# Patient Record
Sex: Female | Born: 1965 | Race: Black or African American | Hispanic: No | Marital: Married | Smoking: Never smoker
Health system: Southern US, Community
[De-identification: ages and names within clinical notes are randomized; demographics above are authoritative.]

## PROBLEM LIST (undated history)

## (undated) DIAGNOSIS — I10 Essential (primary) hypertension: Secondary | ICD-10-CM

## (undated) DIAGNOSIS — D649 Anemia, unspecified: Secondary | ICD-10-CM

## (undated) DIAGNOSIS — E079 Disorder of thyroid, unspecified: Secondary | ICD-10-CM

## (undated) HISTORY — DX: Anemia, unspecified: D64.9

## (undated) HISTORY — DX: Disorder of thyroid, unspecified: E07.9

## (undated) HISTORY — DX: Essential (primary) hypertension: I10

---

## 2004-09-14 DIAGNOSIS — D509 Iron deficiency anemia, unspecified: Secondary | ICD-10-CM | POA: Insufficient documentation

## 2004-12-04 ENCOUNTER — Ambulatory Visit: Payer: Self-pay | Admitting: Family Medicine

## 2005-05-20 ENCOUNTER — Emergency Department: Payer: Self-pay | Admitting: Emergency Medicine

## 2005-12-30 ENCOUNTER — Ambulatory Visit: Payer: Self-pay | Admitting: Family Medicine

## 2007-01-06 ENCOUNTER — Ambulatory Visit: Payer: Self-pay | Admitting: Family Medicine

## 2007-02-14 ENCOUNTER — Emergency Department: Payer: Self-pay | Admitting: Unknown Physician Specialty

## 2007-04-18 ENCOUNTER — Ambulatory Visit: Payer: Self-pay | Admitting: Family Medicine

## 2007-12-25 ENCOUNTER — Emergency Department: Payer: Self-pay | Admitting: Emergency Medicine

## 2008-01-05 ENCOUNTER — Ambulatory Visit: Payer: Self-pay

## 2008-01-10 ENCOUNTER — Ambulatory Visit: Payer: Self-pay | Admitting: Family Medicine

## 2008-02-03 ENCOUNTER — Encounter: Payer: Self-pay | Admitting: Orthopaedic Surgery

## 2008-02-13 ENCOUNTER — Encounter: Payer: Self-pay | Admitting: Orthopaedic Surgery

## 2008-08-19 ENCOUNTER — Emergency Department: Payer: Self-pay | Admitting: Emergency Medicine

## 2008-08-20 ENCOUNTER — Ambulatory Visit: Payer: Self-pay | Admitting: Unknown Physician Specialty

## 2008-10-16 ENCOUNTER — Encounter: Payer: Self-pay | Admitting: General Practice

## 2008-11-12 ENCOUNTER — Encounter: Payer: Self-pay | Admitting: General Practice

## 2008-12-13 ENCOUNTER — Encounter: Payer: Self-pay | Admitting: General Practice

## 2009-01-15 ENCOUNTER — Ambulatory Visit: Payer: Self-pay | Admitting: Family Medicine

## 2009-04-04 IMAGING — CR LEFT WRIST - 2 VIEW
1 series · 2 of 2 positions shown · non-contrast
Comparison: none

REASON FOR EXAM: pain      rm 2
COMMENTS:

PROCEDURE:     DXR - DXR WRIST LEFT AP AND LATERAL  - August 19, 2008  [DATE]
RESULT:     An impacted distal comminuted posteriorly angulated and
displaced distal radius fracture is appreciated. A displaced ulnar styloid
fracture is also appreciated.

[Series 1: view not recorded · 0.17mm/px · 2 of 2 slices shown]
[im 1/2]
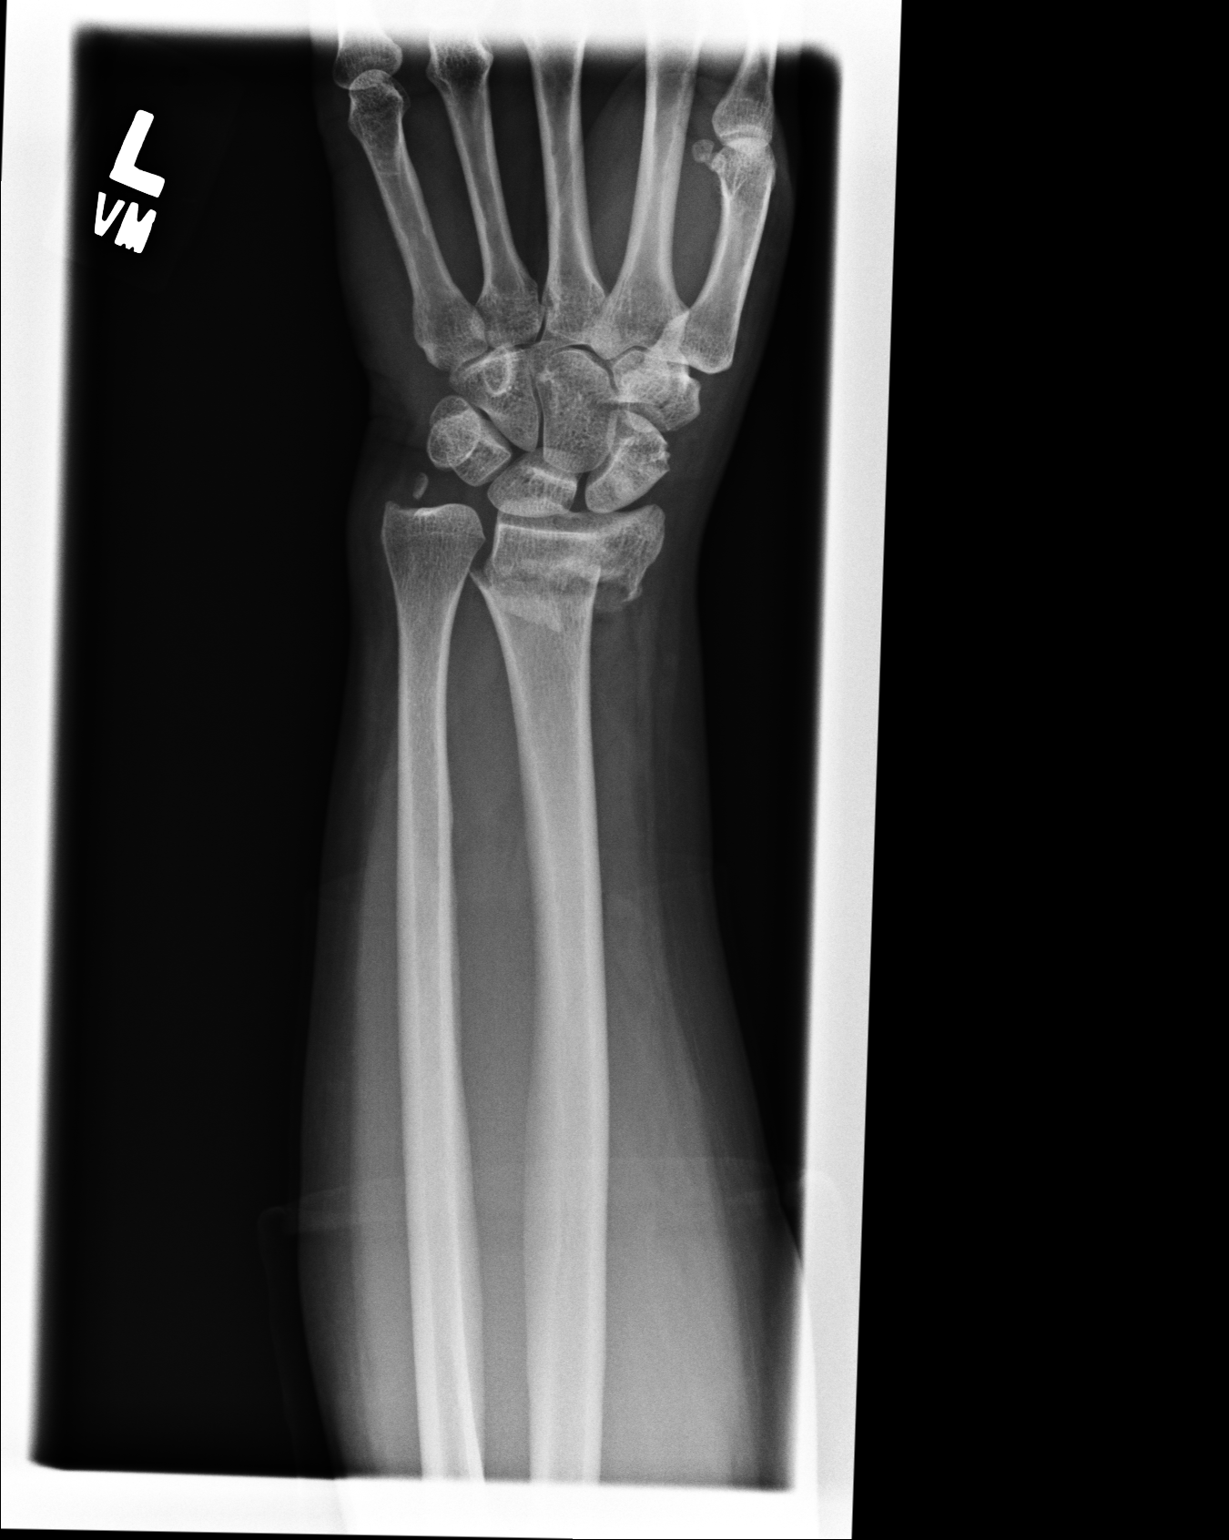
[im 2/2]
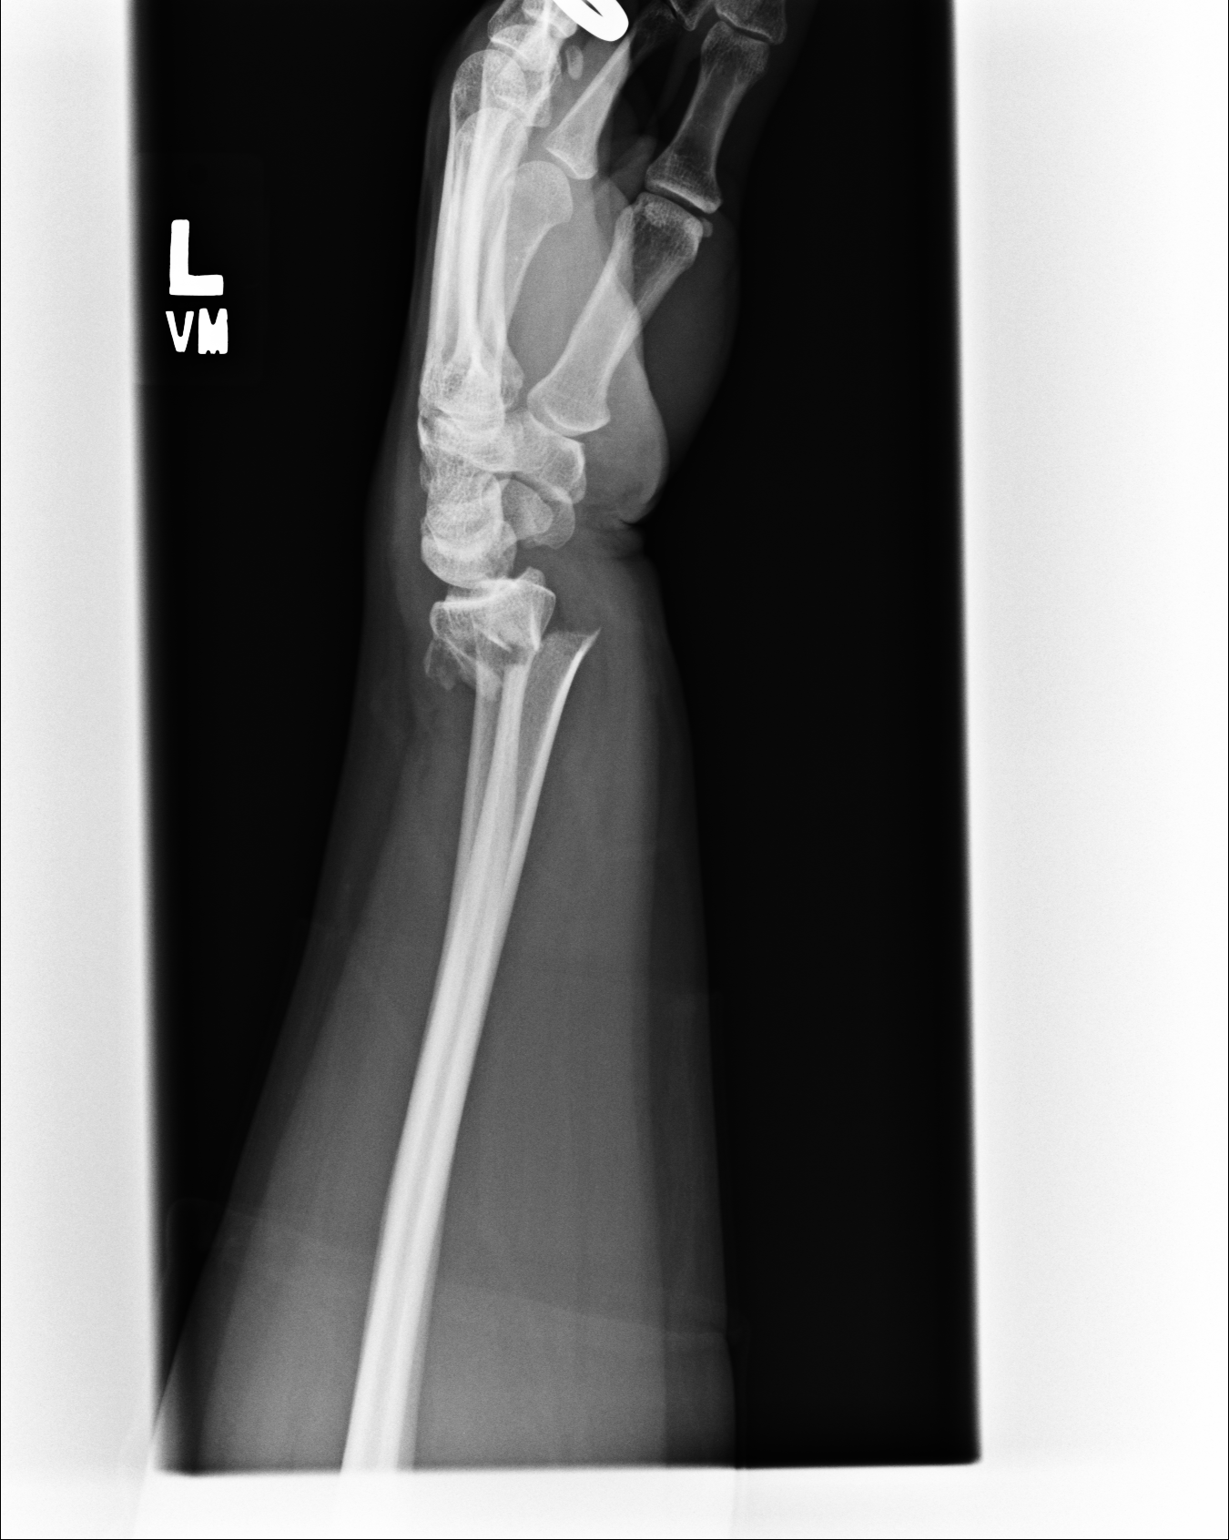

[2 of 2 positions shown; findings below may reference images not displayed]

IMPRESSION: Distal radial and ulna fracture as described above.

## 2010-01-16 ENCOUNTER — Ambulatory Visit: Payer: Self-pay | Admitting: Family Medicine

## 2010-01-24 ENCOUNTER — Ambulatory Visit: Payer: Self-pay | Admitting: Family Medicine

## 2011-01-20 ENCOUNTER — Ambulatory Visit: Payer: Self-pay | Admitting: Family Medicine

## 2011-01-20 LAB — HM MAMMOGRAPHY

## 2014-05-07 LAB — TSH: TSH: 0 u[IU]/mL — AB (ref <=5.90)

## 2014-05-07 LAB — HEMOGLOBIN A1C: HEMOGLOBIN A1C: 5.6

## 2015-08-01 ENCOUNTER — Telehealth: Payer: Self-pay

## 2015-08-01 NOTE — Telephone Encounter (Signed)
Called pt to schedule appointment to FU on labs for hyperthyroidism. LMTCB. Allene DillonEmily Drozdowski, CMA

## 2015-10-01 ENCOUNTER — Other Ambulatory Visit: Payer: Self-pay | Admitting: Family Medicine

## 2015-10-01 DIAGNOSIS — I10 Essential (primary) hypertension: Secondary | ICD-10-CM | POA: Insufficient documentation

## 2015-10-01 NOTE — Telephone Encounter (Signed)
Last OV 04/2014  Thanks,   -Loucile Posner  

## 2015-12-03 ENCOUNTER — Other Ambulatory Visit: Payer: Self-pay | Admitting: Family Medicine

## 2016-03-03 LAB — BASIC METABOLIC PANEL
BUN: 13 mg/dL (ref 4–21)
GLUCOSE: 101 mg/dL
POTASSIUM: 4.1 mmol/L (ref 3.4–5.3)
Sodium: 139 mmol/L (ref 137–147)

## 2016-03-03 LAB — TSH: TSH: 0.01 u[IU]/mL — AB (ref 0.41–5.90)

## 2016-03-03 LAB — HEPATIC FUNCTION PANEL
ALT: 20 U/L (ref 7–35)
AST: 14 U/L (ref 13–35)

## 2016-03-03 LAB — LIPID PANEL
Cholesterol: 106 mg/dL (ref 0–200)
HDL: 51 mg/dL (ref 35–70)
LDL Cholesterol: 43 mg/dL
Triglycerides: 58 mg/dL (ref 40–160)

## 2016-03-03 LAB — CBC AND DIFFERENTIAL
HCT: 39 % (ref 36–46)
Hemoglobin: 12.9 g/dL (ref 12.0–16.0)
PLATELETS: 260 10*3/uL (ref 150–399)
WBC: 8.5 10*3/mL

## 2016-03-03 LAB — HEMOGLOBIN A1C: Hemoglobin A1C: 5.8

## 2016-03-09 ENCOUNTER — Ambulatory Visit: Payer: Self-pay | Admitting: Family Medicine

## 2016-03-09 DIAGNOSIS — R739 Hyperglycemia, unspecified: Secondary | ICD-10-CM | POA: Insufficient documentation

## 2016-03-09 DIAGNOSIS — E0500 Thyrotoxicosis with diffuse goiter without thyrotoxic crisis or storm: Secondary | ICD-10-CM | POA: Insufficient documentation

## 2016-03-09 DIAGNOSIS — R011 Cardiac murmur, unspecified: Secondary | ICD-10-CM | POA: Insufficient documentation

## 2016-03-09 DIAGNOSIS — E559 Vitamin D deficiency, unspecified: Secondary | ICD-10-CM | POA: Insufficient documentation

## 2016-03-09 DIAGNOSIS — E0590 Thyrotoxicosis, unspecified without thyrotoxic crisis or storm: Secondary | ICD-10-CM | POA: Insufficient documentation

## 2016-03-10 ENCOUNTER — Encounter: Payer: Self-pay | Admitting: Family Medicine

## 2016-03-10 ENCOUNTER — Ambulatory Visit (INDEPENDENT_AMBULATORY_CARE_PROVIDER_SITE_OTHER): Payer: BLUE CROSS/BLUE SHIELD | Admitting: Family Medicine

## 2016-03-10 VITALS — BP 126/80 | HR 89 | Temp 98.7°F | Resp 20 | Ht 66.0 in | Wt 217.0 lb

## 2016-03-10 DIAGNOSIS — E0590 Thyrotoxicosis, unspecified without thyrotoxic crisis or storm: Secondary | ICD-10-CM

## 2016-03-10 DIAGNOSIS — R011 Cardiac murmur, unspecified: Secondary | ICD-10-CM | POA: Diagnosis not present

## 2016-03-10 DIAGNOSIS — I10 Essential (primary) hypertension: Secondary | ICD-10-CM

## 2016-03-10 DIAGNOSIS — R739 Hyperglycemia, unspecified: Secondary | ICD-10-CM | POA: Diagnosis not present

## 2016-03-10 DIAGNOSIS — J069 Acute upper respiratory infection, unspecified: Secondary | ICD-10-CM | POA: Diagnosis not present

## 2016-03-10 NOTE — Progress Notes (Signed)
Patient: Erin Macdonald Female    DOB: 05-22-1966   50 y.o.   MRN: [id number] Visit Date: 03/10/2016  Today's Provider: Lorie PhenixNancy Janat Tabbert, MD   Chief Complaint  Patient presents with  . Hyperthyroidism  . Hypertension  . Cough   Subjective:    HPI  Hyperthyroid, follow-up:  TSH  Date Value Ref Range Status  05/07/2014 0.00* .41 - 5.90 uIU/mL Final   Wt Readings from Last 3 Encounters:  03/10/16 217 lb (98.431 kg)  05/07/14 210 lb (95.255 kg)    She was last seen for hypothyroid 2 years ago.  Management since that visit includes checking labs. Lab result: TSH 0.005; T4 18.9; T3 43; Free Thyroxine Index 8.1. Patient advised to increase medication to 10 mg TID and recheck labs in 4 weeks. Patient brought in lab results that were ordered by her work place. Labs were done on 03/03/2016. TSH <0.006; T4 13.9; T3 50; Free Thyroxine 7.0. She reports poor compliance with treatment due to no insurance coverage. She is not having side effects.  She is not exercising. She is experiencing none She denies change in energy level Weight trend: stable  ------------------------------------------------------------------------   Hypertension, follow-up:  BP Readings from Last 3 Encounters:  03/10/16 126/80  05/07/14 136/60    She was last seen for hypertension 2 years ago.  BP at that visit was 136/60. Management changes since that visit include labs checked. She reports excellent compliance with treatment. She is not having side effects. She is not exercising. She is not adherent to low salt diet.   Outside blood pressures are stable. She is experiencing none.  Patient denies chest pain.   Cardiovascular risk factors include obesity (BMI >= 30 kg/m2).  Use of agents associated with hypertension: none.     Weight trend: stable Wt Readings from Last 3 Encounters:  03/10/16 217 lb (98.431 kg)  05/07/14 210 lb (95.255 kg)   Current diet: in general, a "healthy" diet     ------------------------------------------------------------------------  Cough: Patient complains of productive cough and sore throat.  Symptoms began 1 week ago.  The cough is non-productive, without wheezing, dyspnea or hemoptysis, with shortness of breath during the cough, nocturnal and is aggravated by cold air and dust Associated symptoms include:chills. Patient does not have new pets. Patient does not have a history of asthma. Patient does have a history of environmental allergens. Patient does not have recent travel. Patient does not have a history of smoking. Patient was started on ABX by nurse at her work last Friday.      Not on File Current Meds  Medication Sig  . amoxicillin (AMOXIL) 500 MG tablet Take 500 mg by mouth 2 (two) times daily.  . methimazole (TAPAZOLE) 10 MG tablet Take 10 mg by mouth daily.  . metoprolol tartrate (LOPRESSOR) 25 MG tablet Take 1 tablet (25 mg total) by mouth 2 (two) times daily. Pt needs to scheduled an office visit before anymore refills.    Review of Systems  Constitutional: Negative.   HENT: Positive for congestion and sore throat.   Respiratory: Positive for cough and shortness of breath.   Endocrine: Negative.     Social History  Substance Use Topics  . Smoking status: Never Smoker   . Smokeless tobacco: Never Used  . Alcohol Use: No   Objective:   BP 126/80 mmHg  Pulse 89  Temp(Src) 98.7 F (37.1 C) (Oral)  Resp 20  Ht 5\' 6"  (1.676 m)  Wt 217 lb (98.431 kg)  BMI 35.04 kg/m2  SpO2 98%  LMP 02/13/2016 (Approximate)  Physical Exam  Constitutional: She is oriented to person, place, and time. She appears well-developed and well-nourished.  HENT:  Head: Normocephalic and atraumatic.  Right Ear: External ear normal.  Left Ear: External ear normal.  Mouth/Throat: Oropharynx is clear and moist.  turbinates are swollen   Cardiovascular: Normal rate and regular rhythm.   Murmur heard.  Systolic murmur is present   Pulmonary/Chest: Effort normal and breath sounds normal.  Neurological: She is alert and oriented to person, place, and time.  Psychiatric: She has a normal mood and affect. Her behavior is normal. Judgment and thought content normal.        Assessment & Plan:     1. Hyperthyroidism Stable. Patient advised to increase Tapazole 10 mg BID and recheck labs in 6 weeks.  2. Essential hypertension Stable. Patient advised to continue current medication and plan of care.  3. Blood glucose elevated Stable.  4. Ejection murmur Referred to cardiololgy for evaluation and follow-up. - Ambulatory referral to Cardiology  5. Upper respiratory infection New problem. Improving. Patient advised to continue ABX.      Patient seen and examined by Dr. Leo GrosserNancy J.. Keyontay Macdonald, and note scribed by Erin Macdonald, CMA.  I have reviewed the document for accuracy and completeness and I agree with above. - Erin GrosserNancy J. Erin Davidovich, MD   Erin PhenixNancy Shenise Wolgamott, MD  [facility]

## 2016-03-10 NOTE — Patient Instructions (Signed)
Start taking over the counter Vitamin D 2000 units daily. Increase Tapazole 10 mg 2 times daily.

## 2016-03-11 ENCOUNTER — Other Ambulatory Visit: Payer: Self-pay

## 2016-03-11 DIAGNOSIS — E0590 Thyrotoxicosis, unspecified without thyrotoxic crisis or storm: Secondary | ICD-10-CM

## 2016-03-11 MED ORDER — METHIMAZOLE 10 MG PO TABS
10.0000 mg | ORAL_TABLET | Freq: Two times a day (BID) | ORAL | Status: DC
Start: 2016-03-11 — End: 2016-08-03

## 2016-03-11 NOTE — Telephone Encounter (Signed)
Pt called stating she was supposed to have 2 medication changes sent in to pharmacy at OV yesterday. Medications are Tapazole and Metoprolol. Did see the Tapazole change, but not Metoprolol. Please advise. Allene DillonEmily Drozdowski, CMA

## 2016-03-12 ENCOUNTER — Other Ambulatory Visit: Payer: Self-pay | Admitting: Family Medicine

## 2016-03-12 DIAGNOSIS — I10 Essential (primary) hypertension: Secondary | ICD-10-CM

## 2016-03-12 MED ORDER — METOPROLOL TARTRATE 25 MG PO TABS: 25.0000 mg | ORAL_TABLET | Freq: Two times a day (BID) | ORAL | Status: DC

## 2016-03-12 NOTE — Telephone Encounter (Signed)
Pt contacted office for refill request on the following medications:  metoprolol tartrate (LOPRESSOR) 25 MG tablet.  [facility] [address]  [phone]#[phone]/MW

## 2016-03-19 ENCOUNTER — Encounter: Payer: Self-pay | Admitting: Family Medicine

## 2016-04-03 DIAGNOSIS — R011 Cardiac murmur, unspecified: Secondary | ICD-10-CM | POA: Diagnosis not present

## 2016-04-03 DIAGNOSIS — D649 Anemia, unspecified: Secondary | ICD-10-CM | POA: Diagnosis not present

## 2016-04-03 DIAGNOSIS — R0602 Shortness of breath: Secondary | ICD-10-CM | POA: Diagnosis not present

## 2016-04-03 DIAGNOSIS — I10 Essential (primary) hypertension: Secondary | ICD-10-CM | POA: Diagnosis not present

## 2016-04-20 DIAGNOSIS — I10 Essential (primary) hypertension: Secondary | ICD-10-CM | POA: Diagnosis not present

## 2016-04-20 DIAGNOSIS — R0602 Shortness of breath: Secondary | ICD-10-CM | POA: Diagnosis not present

## 2016-04-20 DIAGNOSIS — I351 Nonrheumatic aortic (valve) insufficiency: Secondary | ICD-10-CM | POA: Diagnosis not present

## 2016-04-20 DIAGNOSIS — R011 Cardiac murmur, unspecified: Secondary | ICD-10-CM | POA: Diagnosis not present

## 2016-04-21 ENCOUNTER — Encounter: Payer: Self-pay | Admitting: Family Medicine

## 2016-04-21 ENCOUNTER — Ambulatory Visit (INDEPENDENT_AMBULATORY_CARE_PROVIDER_SITE_OTHER): Payer: BLUE CROSS/BLUE SHIELD | Admitting: Family Medicine

## 2016-04-21 VITALS — BP 122/68 | HR 84 | Temp 98.5°F | Resp 16 | Wt 221.0 lb

## 2016-04-21 DIAGNOSIS — E0590 Thyrotoxicosis, unspecified without thyrotoxic crisis or storm: Secondary | ICD-10-CM | POA: Diagnosis not present

## 2016-04-21 DIAGNOSIS — I10 Essential (primary) hypertension: Secondary | ICD-10-CM | POA: Diagnosis not present

## 2016-04-21 DIAGNOSIS — R011 Cardiac murmur, unspecified: Secondary | ICD-10-CM | POA: Diagnosis not present

## 2016-04-21 NOTE — Progress Notes (Signed)
Patient: Erin Macdonald Female    DOB: [birth year]   50 y.o.   MRN: [id number] Visit Date: 04/21/2016  Today's Provider: Mila Merryonald Montie Swiderski, MD   Chief Complaint  Patient presents with  . Hyperthyroidism  . Hypertension   Subjective:    Thyroid Problem  Presents for follow-up visit. Symptoms include heat intolerance. Patient reports no anxiety, cold intolerance, constipation, depressed mood, diaphoresis, fatigue, hair loss, menstrual problem, nail problem, palpitations or tremors.  Hypertension  This is a chronic problem. The problem is unchanged. The problem is controlled. Associated symptoms include chest pain (Occasional sharp chest pain/ had a cardiology apt yesterday.). Pertinent negatives include no headaches, palpitations or shortness of breath. There are no compliance problems.  Hypertensive end-organ damage includes a thyroid problem.   Lab Results  Component Value Date   TSH 0.01 (A) 03/03/2016   Patient was to have increased Tapazole to BId after having labs done in June, however, she states she hadn't been taking medications regularly prior to that, so she just took 2 a day for about a week, then resumed one tablet day. She states she feels much better since being back on medications regularly.     Not on File Current Meds  Medication Sig  . Cholecalciferol (VITAMIN D) 2000 units CAPS Take 1 capsule by mouth daily.  . methimazole (TAPAZOLE) 10 MG tablet Take 1 tablet (10 mg total) by mouth 2 (two) times daily. (Patient taking differently: Take 10 mg by mouth daily. )  . metoprolol tartrate (LOPRESSOR) 25 MG tablet Take 1 tablet (25 mg total) by mouth 2 (two) times daily. (TAKNG ONLY ONCE DAILY)    Review of Systems  Constitutional: Negative for activity change, appetite change, chills, diaphoresis, fatigue, fever and unexpected weight change.  Respiratory: Negative for apnea, cough, choking, chest tightness, shortness of breath, wheezing and stridor.     Cardiovascular: Positive for chest pain (Occasional sharp chest pain/ had a cardiology apt yesterday.). Negative for palpitations.  Gastrointestinal: Negative.  Negative for constipation.  Endocrine: Positive for heat intolerance. Negative for cold intolerance, polydipsia, polyphagia and polyuria.  Genitourinary: Negative for menstrual problem.  Musculoskeletal: Negative.   Neurological: Negative for dizziness, tremors, weakness, light-headedness, numbness and headaches.  Psychiatric/Behavioral: The patient is not nervous/anxious.     Social History  Substance Use Topics  . Smoking status: Never Smoker  . Smokeless tobacco: Never Used  . Alcohol use No   Objective:   BP 122/68 (BP Location: Left Arm, Patient Position: Sitting, Cuff Size: Large)   Pulse 84   Temp 98.5 F (36.9 C) (Oral)   Resp 16   Wt 221 lb (100.2 kg)   BMI 35.67 kg/m   Physical Exam   General Appearance:    Alert, cooperative, no distress  Eyes:    PERRL, conjunctiva/corneas clear, EOM's intact       Lungs:     Clear to auscultation bilaterally, respirations unlabored  Heart:    Regular rate and rhythm  Neurologic:   Awake, alert, oriented x 3. No apparent focal neurological           defect.           Assessment & Plan:     1. Hyperthyroidism Symptomatically improved since being back on Tapazole, but only taking 10mg  once a day - T4 AND TSH  2. Essential hypertension Well controlled.  Continue current medications.         Mila Merryonald Dorissa Stinnette, MD  [facility]

## 2016-04-23 ENCOUNTER — Telehealth: Payer: Self-pay | Admitting: *Deleted

## 2016-04-23 LAB — T4 AND TSH
THYROXINE (T4): 13.4
TSH: 0.006

## 2016-04-23 NOTE — Telephone Encounter (Signed)
Patient requesting refills

## 2016-04-24 NOTE — Telephone Encounter (Signed)
[facility] are refills available at pharmacy for metoprolol and methimazole.

## 2016-08-03 ENCOUNTER — Other Ambulatory Visit: Payer: Self-pay | Admitting: Family Medicine

## 2016-08-03 ENCOUNTER — Other Ambulatory Visit: Payer: Self-pay

## 2016-08-03 DIAGNOSIS — E0590 Thyrotoxicosis, unspecified without thyrotoxic crisis or storm: Secondary | ICD-10-CM

## 2016-08-03 MED ORDER — METHIMAZOLE 10 MG PO TABS: 10.0000 mg | ORAL_TABLET | Freq: Every day | ORAL | 6 refills | Status: DC

## 2016-08-03 MED ORDER — METHIMAZOLE 10 MG PO TABS: 10.0000 mg | ORAL_TABLET | Freq: Every day | ORAL | 1 refills | Status: DC

## 2016-08-03 NOTE — Telephone Encounter (Signed)
Pharmacy requesting refills.

## 2016-09-01 ENCOUNTER — Other Ambulatory Visit: Payer: Self-pay | Admitting: Family Medicine

## 2016-09-01 DIAGNOSIS — I10 Essential (primary) hypertension: Secondary | ICD-10-CM

## 2016-09-01 NOTE — Telephone Encounter (Signed)
Last ov 04/23/16

## 2016-11-18 ENCOUNTER — Telehealth: Payer: Self-pay

## 2016-11-18 NOTE — Telephone Encounter (Signed)
[facility] called because there was an issue with medication distributed to patient. Pharmacist states that patient was given two bottles that were suppose to be for Cetrizine and Metoprolol. When patient got home she realized that both bottles were labeled Cetrizine. Pharmacist states that patient came back to pharmacy and was upset about error made and states she didn't know if she took Cetrizine or Metoprolol when she got home. Pharmacist wanted to bring it up to the doctors attention in case patient calls back office with concerns. KW

## 2016-12-01 ENCOUNTER — Telehealth: Payer: Self-pay | Admitting: Family Medicine

## 2016-12-01 DIAGNOSIS — Z1239 Encounter for other screening for malignant neoplasm of breast: Secondary | ICD-10-CM

## 2016-12-01 NOTE — Telephone Encounter (Signed)
Order has been placed and faxed to the number listed below.

## 2016-12-01 NOTE — Telephone Encounter (Signed)
OK 

## 2016-12-01 NOTE — Telephone Encounter (Signed)
Pt stated that she can get her mammogram through her work and she needs an order faxed to [phone] [facility] Please advise. Thanks TNP

## 2016-12-01 NOTE — Telephone Encounter (Signed)
Ok to fax the order? Please advise. Thanks!

## 2016-12-01 NOTE — Telephone Encounter (Signed)
Pt called back stating that she gave the wrong fax # when she called the first time. Pt is requesting the mammogram order be faxed to FAX# [phone]. Please advise. Thanks TNP

## 2016-12-22 ENCOUNTER — Ambulatory Visit (INDEPENDENT_AMBULATORY_CARE_PROVIDER_SITE_OTHER): Payer: BLUE CROSS/BLUE SHIELD | Admitting: Family Medicine

## 2016-12-22 ENCOUNTER — Encounter: Payer: Self-pay | Admitting: Family Medicine

## 2016-12-22 VITALS — BP 148/80 | HR 96 | Temp 98.2°F | Resp 18 | Wt 223.0 lb

## 2016-12-22 DIAGNOSIS — J069 Acute upper respiratory infection, unspecified: Secondary | ICD-10-CM | POA: Diagnosis not present

## 2016-12-22 DIAGNOSIS — R05 Cough: Secondary | ICD-10-CM

## 2016-12-22 DIAGNOSIS — R059 Cough, unspecified: Secondary | ICD-10-CM

## 2016-12-22 MED ORDER — HYDROCODONE-HOMATROPINE 5-1.5 MG/5ML PO SYRP: 5.0000 mL | ORAL_SOLUTION | Freq: Three times a day (TID) | ORAL | 0 refills | Status: DC | PRN

## 2016-12-22 NOTE — Patient Instructions (Signed)
Upper Respiratory Infection, Adult Most upper respiratory infections (URIs) are caused by a virus. A URI affects the nose, throat, and upper air passages. The most common type of URI is often called "the common cold." Follow these instructions at home:  Take medicines only as told by your doctor.  Gargle warm saltwater or take cough drops to comfort your throat as told by your doctor.  Use a warm mist humidifier or inhale steam from a shower to increase air moisture. This may make it easier to breathe.  Drink enough fluid to keep your pee (urine) clear or pale yellow.  Eat soups and other clear broths.  Have a healthy diet.  Rest as needed.  Go back to work when your fever is gone or your doctor says it is okay.  You may need to stay home longer to avoid giving your URI to others.  You can also wear a face mask and wash your hands often to prevent spread of the virus.  Use your inhaler more if you have asthma.  Do not use any tobacco products, including cigarettes, chewing tobacco, or electronic cigarettes. If you need help quitting, ask your doctor. Contact a doctor if:  You are getting worse, not better.  Your symptoms are not helped by medicine.  You have chills.  You are getting more short of breath.  You have brown or red mucus.  You have yellow or brown discharge from your nose.  You have pain in your face, especially when you bend forward.  You have a fever.  You have puffy (swollen) neck glands.  You have pain while swallowing.  You have white areas in the back of your throat. Get help right away if:  You have very bad or constant:  Headache.  Ear pain.  Pain in your forehead, behind your eyes, and over your cheekbones (sinus pain).  Chest pain.  You have long-lasting (chronic) lung disease and any of the following:  Wheezing.  Long-lasting cough.  Coughing up blood.  A change in your usual mucus.  You have a stiff neck.  You have  changes in your:  Vision.  Hearing.  Thinking.  Mood. This information is not intended to replace advice given to you by your health care provider. Make sure you discuss any questions you have with your health care provider. Document Released: 02/17/2008 Document Revised: 05/03/2016 Document Reviewed: 12/06/2013 Elsevier Interactive Patient Education  2017 [employer]vier Inc.  

## 2016-12-22 NOTE — Progress Notes (Signed)
Patient: Erin Macdonald Female    DOB: 06-16-1966   51 y.o.   MRN: [id number] Visit Date: 12/22/2016  Today's Provider: Mila Merry, MD   Chief Complaint  Patient presents with  . Cough    x 4 days   Subjective:    Cough  This is a new problem. Episode onset: 4 days ago. The problem has been waxing and waning. The cough is productive of sputum (scant production). Associated symptoms include chills, headaches, myalgias, shortness of breath, sweats and wheezing. Pertinent negatives include no chest pain, ear congestion, ear pain, hemoptysis, nasal congestion, postnasal drip, rhinorrhea or sore throat.  Patient states, two days ago she was prescribed Prednisone, Benzonatate and and Albuterol inhaler by [employer] [facility] through her job. She reports that she just started this yesterday and has not yet seen any improvement in her symptoms. Cough is worse at night and keeps her awake, but improves during day or when she sits up. Had chills a few days ago. Throat is dry but not painful.      Not on File   Current Outpatient Prescriptions:  .  Cholecalciferol (VITAMIN D) 2000 units CAPS, Take 1 capsule by mouth daily., Disp: , Rfl:  .  methimazole (TAPAZOLE) 10 MG tablet, Take 1 tablet (10 mg total) by mouth daily., Disp: 90 tablet, Rfl: 1 .  metoprolol tartrate (LOPRESSOR) 25 MG tablet, TAKE 1 TABLET (25 MG TOTAL) BY MOUTH 2 (TWO) TIMES DAILY., Disp: 60 tablet, Rfl: 12  Review of Systems  Constitutional: Positive for chills and diaphoresis.  HENT: Negative for congestion, ear discharge, ear pain, postnasal drip, rhinorrhea, sinus pain, sinus pressure, sneezing and sore throat.   Respiratory: Positive for cough, shortness of breath and wheezing. Negative for hemoptysis.   Cardiovascular: Negative for chest pain.  Musculoskeletal: Positive for myalgias.  Neurological: Positive for headaches.  Psychiatric/Behavioral: Positive for sleep disturbance.    Social History  Substance Use  Topics  . Smoking status: Never Smoker  . Smokeless tobacco: Never Used  . Alcohol use No   Objective:   BP (!) 148/80 (BP Location: Right Arm, Patient Position: Sitting, Cuff Size: Large)   Pulse 96   Temp 98.2 F (36.8 C) (Oral)   Resp 18   Wt 223 lb (101.2 kg)   SpO2 98% Comment: room air  BMI 35.99 kg/m  There were no vitals filed for this visit.   Physical Exam  General Appearance:    Alert, cooperative, no distress  HENT:   neck without nodes, pharynx erythematous without exudate, sinuses nontender, post nasal drip noted, nasal mucosa congested and nasal mucosa pale and congested  Eyes:    PERRL, conjunctiva/corneas clear, EOM's intact       Lungs:     Clear to auscultation bilaterally, respirations unlabored  Heart:    Regular rate and rhythm  Neurologic:   Awake, alert, oriented x 3. No apparent focal neurological           defect.           Assessment & Plan:     1. Upper respiratory tract infection, unspecified type Continue prednisone and daytime benzonatate and inhalers.   2. Cough  - HYDROcodone-homatropine (HYCODAN) 5-1.5 MG/5ML syrup; Take 5 mLs by mouth every 8 (eight) hours as needed for cough.  Dispense: 120 mL; Refill: 0  Call if symptoms change or if not rapidly improving.          Mila Merry, MD  [facility]

## 2016-12-29 ENCOUNTER — Other Ambulatory Visit: Payer: Self-pay | Admitting: *Deleted

## 2016-12-29 DIAGNOSIS — Z1231 Encounter for screening mammogram for malignant neoplasm of breast: Secondary | ICD-10-CM

## 2016-12-29 DIAGNOSIS — Z1239 Encounter for other screening for malignant neoplasm of breast: Secondary | ICD-10-CM

## 2017-01-14 DIAGNOSIS — Z1231 Encounter for screening mammogram for malignant neoplasm of breast: Secondary | ICD-10-CM | POA: Diagnosis not present

## 2017-01-14 LAB — HM MAMMOGRAPHY

## 2017-01-16 ENCOUNTER — Other Ambulatory Visit: Payer: Self-pay | Admitting: Family Medicine

## 2017-01-16 DIAGNOSIS — I10 Essential (primary) hypertension: Secondary | ICD-10-CM

## 2017-01-26 ENCOUNTER — Encounter: Payer: Self-pay | Admitting: *Deleted

## 2017-05-19 ENCOUNTER — Other Ambulatory Visit: Payer: Self-pay | Admitting: Family Medicine

## 2017-05-19 DIAGNOSIS — I10 Essential (primary) hypertension: Secondary | ICD-10-CM

## 2017-07-03 ENCOUNTER — Other Ambulatory Visit: Payer: Self-pay | Admitting: Family Medicine

## 2017-07-03 DIAGNOSIS — I10 Essential (primary) hypertension: Secondary | ICD-10-CM

## 2017-07-13 ENCOUNTER — Encounter: Payer: Self-pay | Admitting: Physician Assistant

## 2017-07-19 LAB — HEPATIC FUNCTION PANEL
ALT: 20 (ref 7–35)
AST: 18 (ref 13–35)

## 2017-07-19 LAB — LIPID PANEL
Cholesterol: 112 (ref 0–200)
HDL: 54 (ref 35–70)
LDL CALC: 48
Triglycerides: 48 (ref 40–160)

## 2017-07-19 LAB — CBC AND DIFFERENTIAL
HEMATOCRIT: 35 — AB (ref 36–46)
Hemoglobin: 11.4 — AB (ref 12.0–16.0)
Neutrophils Absolute: 4
Platelets: 227 (ref 150–399)
WBC: 6.1

## 2017-07-19 LAB — HEMOGLOBIN A1C: HEMOGLOBIN A1C: 5.9

## 2017-07-19 LAB — BASIC METABOLIC PANEL
BUN: 7 (ref 4–21)
CREATININE: 0.5 (ref <=1.1)
Glucose: 95

## 2017-07-19 LAB — TSH: TSH: 0.01 — AB (ref <=5.90)

## 2017-07-19 LAB — VITAMIN D 25 HYDROXY (VIT D DEFICIENCY, FRACTURES): VIT D 25 HYDROXY: 11.8

## 2017-08-10 ENCOUNTER — Encounter: Payer: Self-pay | Admitting: Physician Assistant

## 2017-08-10 ENCOUNTER — Other Ambulatory Visit: Payer: Self-pay

## 2017-08-10 ENCOUNTER — Ambulatory Visit (INDEPENDENT_AMBULATORY_CARE_PROVIDER_SITE_OTHER): Payer: BLUE CROSS/BLUE SHIELD | Admitting: Physician Assistant

## 2017-08-10 VITALS — BP 160/88 | HR 92 | Temp 98.2°F | Resp 16 | Ht 66.0 in | Wt 215.6 lb

## 2017-08-10 DIAGNOSIS — Z1211 Encounter for screening for malignant neoplasm of colon: Secondary | ICD-10-CM

## 2017-08-10 DIAGNOSIS — Z13220 Encounter for screening for lipoid disorders: Secondary | ICD-10-CM

## 2017-08-10 DIAGNOSIS — Z124 Encounter for screening for malignant neoplasm of cervix: Secondary | ICD-10-CM | POA: Diagnosis not present

## 2017-08-10 DIAGNOSIS — R739 Hyperglycemia, unspecified: Secondary | ICD-10-CM

## 2017-08-10 DIAGNOSIS — E0590 Thyrotoxicosis, unspecified without thyrotoxic crisis or storm: Secondary | ICD-10-CM | POA: Diagnosis not present

## 2017-08-10 DIAGNOSIS — Z136 Encounter for screening for cardiovascular disorders: Secondary | ICD-10-CM | POA: Diagnosis not present

## 2017-08-10 DIAGNOSIS — Z114 Encounter for screening for human immunodeficiency virus [HIV]: Secondary | ICD-10-CM | POA: Diagnosis not present

## 2017-08-10 DIAGNOSIS — I10 Essential (primary) hypertension: Secondary | ICD-10-CM | POA: Diagnosis not present

## 2017-08-10 DIAGNOSIS — Z1159 Encounter for screening for other viral diseases: Secondary | ICD-10-CM

## 2017-08-10 DIAGNOSIS — E0500 Thyrotoxicosis with diffuse goiter without thyrotoxic crisis or storm: Secondary | ICD-10-CM | POA: Diagnosis not present

## 2017-08-10 DIAGNOSIS — Z0000 Encounter for general adult medical examination without abnormal findings: Secondary | ICD-10-CM | POA: Diagnosis not present

## 2017-08-10 LAB — IFOBT (OCCULT BLOOD): IFOBT: NEGATIVE

## 2017-08-10 NOTE — Progress Notes (Addendum)
Patient: Erin Macdonald, Female    DOB: June 03, 1966, 51 y.o.   MRN: [id number] Visit Date: 08/10/2017  Today's Provider: Margaretann LovelessJennifer M Kensey Luepke, PA-C   Chief Complaint  Patient presents with  . Annual Exam   Subjective:    Annual physical exam Erin P Laurence ComptonBarton is a 51 y.o. female who presents today for health maintenance and complete physical. She feels well. She reports exercising,sometimes. She reports she is sleeping fairly well. -----------------------------------------------------------------  Declined Influenza Vaccine today. Per patient she is UTD on her tetanus vaccine-no record Mammogram:01/14/2017 ACR BI-RADS 2:Benign  Review of Systems  Constitutional: Negative.   HENT: Negative.   Eyes: Negative.   Respiratory: Negative.   Cardiovascular: Negative.   Gastrointestinal: Negative.   Endocrine: Negative.   Genitourinary: Negative.   Musculoskeletal: Negative.   Skin: Negative.   Allergic/Immunologic: Negative.   Neurological: Negative.   Hematological: Negative.   Psychiatric/Behavioral: Negative.     Social History      She  reports that  has never smoked. she has never used smokeless tobacco. She reports that she does not drink alcohol or use drugs.       Social History   Socioeconomic History  . Marital status: Married    Spouse name: None  . Number of children: 3  . Years of education: None  . Highest education level: None  Social Needs  . Financial resource strain: None  . Food insecurity - worry: None  . Food insecurity - inability: None  . Transportation needs - medical: None  . Transportation needs - non-medical: None  Occupational History  . None  Tobacco Use  . Smoking status: Never Smoker  . Smokeless tobacco: Never Used  Substance and Sexual Activity  . Alcohol use: No  . Drug use: No  . Sexual activity: None  Other Topics Concern  . None  Social History Narrative  . None    Past Medical History:  Diagnosis Date  .  Anemia   . Hypertension   . Thyroid disease      Patient Active Problem List   Diagnosis Date Noted  . 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 Blood glucose elevated 03/09/2016  . Hyperthyroidism 03/09/2016  . Ejection murmur 03/09/2016  . Vitamin D deficiency 03/09/2016  . BP (high blood pressure) 10/01/2015  . Anemia, iron deficiency 09/14/2004    Past Surgical History:  Procedure Laterality Date  . CESAREAN SECTION  96 and 99    Family History        Family Status  Relation Name Status  . Mother  Alive  . Father  Deceased  . Brother  Deceased       MVA        Her family history includes Dementia in her mother; Diabetes in her father; Hypertension in her father and mother.     No Known Allergies   Current Outpatient Medications:  .  Cholecalciferol (VITAMIN D) 2000 units CAPS, Take 1 capsule by mouth daily., Disp: , Rfl:  .  HYDROcodone-homatropine (HYCODAN) 5-1.5 MG/5ML syrup, Take 5 mLs by mouth every 8 (eight) hours as needed for cough., Disp: 120 mL, Rfl: 0 .  methimazole (TAPAZOLE) 10 MG tablet, Take 1 tablet (10 mg total) by mouth daily., Disp: 90 tablet, Rfl: 1 .  metoprolol tartrate (LOPRESSOR) 25 MG tablet, TAKE 1 TABLET BY MOUTH TWICE A DAY, Disp: 60 tablet, Rfl: 0   Patient Care Team: Malva LimesFisher, Donald E, MD as PCP - General (  Family Medicine)      Objective:   Vitals: BP (!) 160/88 (BP Location: Left Arm, Patient Position: Sitting, Cuff Size: Large)   Pulse 92   Temp 98.2 F (36.8 C) (Oral)   Resp 16   Ht 5\' 6"  (1.676 m)   Wt 215 lb 9.6 oz (97.8 kg)   SpO2 99%   BMI 34.80 kg/m     Physical Exam  Constitutional: She is oriented to person, place, and time. She appears well-developed and well-nourished. No distress.  HENT:  Head: Normocephalic and atraumatic.  Right Ear: Hearing, tympanic membrane, external ear and ear canal normal.  Left Ear: Hearing, tympanic membrane, external ear and ear canal normal.  Nose: Nose normal.  Mouth/Throat: Uvula is  midline, oropharynx is clear and moist and mucous membranes are normal. No oropharyngeal exudate.  Eyes: Conjunctivae and EOM are normal. Pupils are equal, round, and reactive to light. Right eye exhibits no discharge. Left eye exhibits no discharge. No scleral icterus.  Neck: Normal range of motion. Neck supple. No JVD present. Carotid bruit is not present. No tracheal deviation present. No thyromegaly present.  Cardiovascular: Normal rate, regular rhythm and intact distal pulses. Exam reveals no gallop and no friction rub.  Murmur heard.  Systolic murmur is present with a grade of 2/6. Pulmonary/Chest: Effort normal and breath sounds normal. No respiratory distress. She has no wheezes. She has no rales. She exhibits no tenderness. Right breast exhibits no inverted nipple, no mass, no nipple discharge, no skin change and no tenderness. Left breast exhibits no inverted nipple, no mass, no nipple discharge, no skin change and no tenderness. Breasts are symmetrical.  Abdominal: Soft. Bowel sounds are normal. She exhibits no distension and no mass. There is no tenderness. There is no rebound and no guarding. Hernia confirmed negative in the right inguinal area and confirmed negative in the left inguinal area.  Genitourinary: Rectum normal, vagina normal and uterus normal. No breast swelling, tenderness, discharge or bleeding. Pelvic exam was performed with patient supine. There is no rash, tenderness, lesion or injury on the right labia. There is no rash, tenderness, lesion or injury on the left labia. Cervix exhibits no motion tenderness, no discharge and no friability. Right adnexum displays no mass, no tenderness and no fullness. Left adnexum displays no mass, no tenderness and no fullness. No erythema, tenderness or bleeding in the vagina. No signs of injury around the vagina. No vaginal discharge found.  Musculoskeletal: Normal range of motion. She exhibits no edema or tenderness.  Lymphadenopathy:     She has no cervical adenopathy.       Right: No inguinal adenopathy present.       Left: No inguinal adenopathy present.  Neurological: She is alert and oriented to person, place, and time. She has normal reflexes. No cranial nerve deficit. Coordination normal.  Skin: Skin is warm and dry. No rash noted. She is not diaphoretic.  Psychiatric: She has a normal mood and affect. Her behavior is normal. Judgment and thought content normal.  Vitals reviewed.    Depression Screen PHQ 2/9 Scores 08/10/2017  PHQ - 2 Score 1      Assessment & Plan:     Routine Health Maintenance and Physical Exam  Exercise Activities and Dietary recommendations Goals    None       There is no immunization history on file for this patient.  Health Maintenance  Topic Date Due  . Hepatitis C Screening  01-19-1966  . HIV Screening  07/28/1979  . TETANUS/TDAP  07/28/1983  . PAP SMEAR  07/27/1985  . COLONOSCOPY  07/27/2014  . INFLUENZA VACCINE  04/14/2017  . MAMMOGRAM  01/14/2018     Discussed health benefits of physical activity, and encouraged her to engage in regular exercise appropriate for her age and condition.    1. Annual physical exam Normal physical exam today. Patient refused labs as she recently had some done through her employer that she will have faxed to our office. She is to call the office in the meantime if she has any acute issue, questions or concerns.  2. Screening for cervical cancer Pap collected today. Will send as below and f/u pending results. - Pap IG and HPV (high risk) DNA detection  3. Colon cancer screening OC lite collected today and was negative. Patient was then agreeable to colonoscopy following OC lite testing. Colonoscopy ordered as below. Patient has never had one. No family history of colon cancer.  - Ambulatory referral to Gastroenterology - IFOBT POC (occult bld, rslt in office)  4. Encounter for lipid screening for cardiovascular disease Patient  refused labs.  5. Essential hypertension Elevated today but patient stressed over her mother and in a rush. Refused labs.   6. Hyperthyroidism H/O this. Refused labs.  7. Toxic diffuse goiter Refused labs. No symptoms.   8. Blood glucose elevated Refused labs.   9. Screening for HIV (human immunodeficiency virus) Refused labs.  10. Need for hepatitis C screening test Refused labs.   --------------------------------------------------------------------    Margaretann LovelessJennifer M Ludmilla Mcgillis, PA-C  [facility]

## 2017-08-10 NOTE — Patient Instructions (Signed)

## 2017-08-11 ENCOUNTER — Telehealth: Payer: Self-pay | Admitting: Physician Assistant

## 2017-08-11 DIAGNOSIS — I10 Essential (primary) hypertension: Secondary | ICD-10-CM

## 2017-08-11 DIAGNOSIS — E559 Vitamin D deficiency, unspecified: Secondary | ICD-10-CM

## 2017-08-11 DIAGNOSIS — E0590 Thyrotoxicosis, unspecified without thyrotoxic crisis or storm: Secondary | ICD-10-CM

## 2017-08-11 MED ORDER — METHIMAZOLE 10 MG PO TABS: 10.0000 mg | ORAL_TABLET | Freq: Every day | ORAL | 1 refills | Status: DC

## 2017-08-11 MED ORDER — VITAMIN D (ERGOCALCIFEROL) 1.25 MG (50000 UNIT) PO CAPS: 50000.0000 [IU] | ORAL_CAPSULE | ORAL | 1 refills | Status: DC

## 2017-08-11 MED ORDER — METOPROLOL TARTRATE 25 MG PO TABS: 25.0000 mg | ORAL_TABLET | Freq: Two times a day (BID) | ORAL | 1 refills | Status: DC

## 2017-08-11 NOTE — Telephone Encounter (Signed)
Please call patient and let her know labs were received and reviewed. Cholesterol is normal. TSH is still showing hyperthyroid. Is she still taking the methimazole? A1c stable at 5.9. Vit D is low at 11.8. Would recommend high dose Vit D supplementation that I can send in for her. Would recommend rechecking TSH in 6-8 weeks.

## 2017-08-11 NOTE — Telephone Encounter (Signed)
[id number] 

## 2017-08-11 NOTE — Telephone Encounter (Signed)
Patient was advised she states that she is still taking Methimazole , she will be picking up prescription for Vit. D this evening. KW

## 2017-08-12 ENCOUNTER — Encounter: Payer: Self-pay | Admitting: Family Medicine

## 2017-08-12 ENCOUNTER — Telehealth: Payer: Self-pay

## 2017-08-12 ENCOUNTER — Encounter: Payer: Self-pay | Admitting: Physician Assistant

## 2017-08-12 NOTE — Telephone Encounter (Signed)
Patient advised as directed below. Patient was also advised that she needs to call individual pharmacies to see who is willing to order the medication from that individual manufacturer. If she can call the pharmacy and get the manufacturer name. Per patient this is ridiculous that she needs to go through this and that the pharmacist was really rude. She also asked in the meantime what did she needed to do because she needs to take the medication tonight. Told patient that we needed to figure out which pharmacy is going to get her medication first before a new prescription was written and that she needed to call the pharmacies. Once she knows which pharmacy will order it to give us a call. Meanwhile since the medication she has is given her cramps not to take. Patient reports that she was going to check on this and give a call tomorrow.   Thanks,  -Joseline

## 2017-08-12 NOTE — Telephone Encounter (Signed)
I guess we should call patient and see if she wants to consider changing pharmacies. She would have to call around to pharmacies to see which one could get the old manufacturer to get that pill.

## 2017-08-12 NOTE — Telephone Encounter (Signed)
Pt stated that she is concerned that [facility] might have given her the wrong medication for metoprolol tartrate (LOPRESSOR) 25 MG tablet because the medication tablet looks different then what she has gotten in the past. Pt request a nurse to return her call to discuss what her medication should look like. I advised pt to contact pharmacy b/c they would know if the medication had changed. Pt stated that she will but she would like to speak with a nurse first. Please advise. Thanks TNP

## 2017-08-12 NOTE — Telephone Encounter (Signed)
FYI-Patient was advised that the pharmacy may have gotten medicine from another manufacturer and that could result in  The pill looking different.  I advised her that she should call them to find out if that was the case.  The patient seems to think we should know if the pharmacy did that and asked me, wouldn't they have notified her if they did that.  I told her I was not sure what her pharmacy does in that situation and that I could not speak for the pharmacy.  O told her it would be best for her to check with them.  She then abruptly said ok and hung up.

## 2017-08-12 NOTE — Telephone Encounter (Signed)
Spoke with patient earlier and she reports that her medication looks different and per Antony ContrasJenni is the same medication and that probably the manufacturer changed. Informed patient we will contact the pharmacy if it is the manufacturer I will asked if possible can they order her medication with the old manufacturer.  At 1:45 called the pharmacy and spoke with the pharmacist and reports that they can't do any special order and they give the medication with with manufacturer they received even if it is the same medication.  Thanks,  -Joseline

## 2017-08-13 LAB — PAP IG AND HPV HIGH-RISK: HPV DNA High Risk: NOT DETECTED

## 2017-08-14 ENCOUNTER — Telehealth: Payer: Self-pay

## 2017-08-14 NOTE — Telephone Encounter (Signed)
-----   Message from Margaretann LovelessJennifer M Burnette, [place]PA-C sent at 08/13/2017  2:52 PM EST ----- Pap is normal, HPV negative.  Will repeat in 3-5 years.

## 2017-08-14 NOTE — Telephone Encounter (Signed)
Patient advised as directed below.  Thanks,  -Shawnika Pepin 

## 2017-10-26 ENCOUNTER — Other Ambulatory Visit: Payer: Self-pay

## 2017-11-08 ENCOUNTER — Telehealth: Payer: Self-pay | Admitting: Physician Assistant

## 2017-11-08 NOTE — Telephone Encounter (Signed)
Patient states that the Vit D that was given to her, she thinks that it is too much wants someone to call to let her know   CB# [phone]

## 2017-11-08 NOTE — Telephone Encounter (Signed)
Patient c/o of cramping all over body. Reports that at first she thought the cramping was coming from the Lopressor since the manufacturer had change. Now she reports that she knows the cramping is coming from the Vitamin D because we gave her a high dose and she wants Antony ContrasJenni to lower the dosage or to give her something else. Ask patient if she has been taking the vitamin since prescribed? Reports she started taking it in December. Advised patient we need to check Vitamin D level again before something new was send in or to lower the dose. Per patient since that's the case she can just schedule an appointment with the nurse at her job and get that done. Advised patient to do what was more convenient to her. Once we receive the results we will call with the next steps.  Thanks,  -Joseline

## 2017-11-23 ENCOUNTER — Encounter: Payer: Self-pay | Admitting: Family Medicine

## 2017-11-23 ENCOUNTER — Ambulatory Visit: Payer: BLUE CROSS/BLUE SHIELD | Admitting: Family Medicine

## 2017-11-23 VITALS — BP 140/74 | HR 77 | Temp 98.8°F | Resp 16

## 2017-11-23 DIAGNOSIS — R252 Cramp and spasm: Secondary | ICD-10-CM

## 2017-11-23 DIAGNOSIS — R748 Abnormal levels of other serum enzymes: Secondary | ICD-10-CM

## 2017-11-23 DIAGNOSIS — D509 Iron deficiency anemia, unspecified: Secondary | ICD-10-CM

## 2017-11-23 DIAGNOSIS — E0590 Thyrotoxicosis, unspecified without thyrotoxic crisis or storm: Secondary | ICD-10-CM | POA: Diagnosis not present

## 2017-11-23 DIAGNOSIS — E559 Vitamin D deficiency, unspecified: Secondary | ICD-10-CM | POA: Diagnosis not present

## 2017-11-23 DIAGNOSIS — M791 Myalgia, unspecified site: Secondary | ICD-10-CM

## 2017-11-23 MED ORDER — BACLOFEN 5 MG PO TABS: 1.0000 | ORAL_TABLET | Freq: Three times a day (TID) | ORAL | 0 refills | Status: DC

## 2017-11-23 NOTE — Progress Notes (Signed)
Patient: Erin Macdonald Female    DOB: Sep 13, 1966   52 y.o.   MRN: [id number] Visit Date: 11/23/2017  Today's Provider: Lelon Huh, MD   Chief Complaint  Patient presents with  . Back Pain  . Leg Pain   Subjective:    Patient states she saw Erin Macdonald on 07/19/2017 for her CPE. Patient was started on Vitamin D 50,000 units. Patient states a couple weeks after she starting taking vitamin D, she started having cramps and spasms in her arms, legs, back. Patient states about 2 weeks ago the cramps and spasms became more severe. Patient states she stopped taking vitamin D over 2 weeks ago. Patient states she is still having the cramps and spasms. This can occur anytime of day.   Seems to worsen after physical activity. No numbness or tingling. Stats she drinks 'tons of water' No dyspnea. No fevers or chills, no known injuries.      No Known Allergies   Current Outpatient Medications:  .  methimazole (TAPAZOLE) 10 MG tablet, Take 1 tablet (10 mg total) by mouth daily., Disp: 90 tablet, Rfl: 1 .  metoprolol tartrate (LOPRESSOR) 25 MG tablet, Take 1 tablet (25 mg total) by mouth 2 (two) times daily., Disp: 180 tablet, Rfl: 1 .  Vitamin D, Ergocalciferol, (DRISDOL) 50000 units CAPS capsule, Take 1 capsule (50,000 Units total) by mouth every 7 (seven) days., Disp: 12 capsule, Rfl: 1  Review of Systems  Constitutional: Negative for appetite change, chills and fatigue.  Respiratory: Negative for chest tightness and shortness of breath.   Cardiovascular: Negative for palpitations.  Gastrointestinal: Negative for nausea and vomiting.  Neurological: Negative for dizziness.    Social History   Tobacco Use  . Smoking status: Never Smoker  . Smokeless tobacco: Never Used  Substance Use Topics  . Alcohol use: No   Objective:   BP 140/74 (BP Location: Right Arm, Patient Position: Sitting, Cuff Size: Large)   Pulse 77   Temp 98.8 F (37.1 C) (Oral)   Resp 16   SpO2 98%   Vitals:   11/23/17 0957  BP: 140/74  Pulse: 77  Resp: 16  Temp: 98.8 F (37.1 C)  TempSrc: Oral  SpO2: 98%     Physical Exam   General Appearance:    Alert, cooperative, Patient is restless, pacing room throughout interview rubbing her arms and legs due to discomfort.   Eyes:    PERRL, conjunctiva/corneas clear, EOM's intact       Lungs:     Clear to auscultation bilaterally, respirations unlabored  Heart:    Regular rate and rhythm  Neurologic:   Awake, alert, oriented x 3. No apparent focal neurological           defect. Muscle strength +5 all extremities and symmetrical. Normal tone. No muscle tenderness, no spasm noted. DTRs+2 in EUs and LEs. No sensory deficits.           Assessment & Plan:     1. Myalgia Patient very uncomfortable. Consider orthopedic or neurology referral after reviewing labs.  - Comprehensive metabolic panel - Magnesium - Sed Rate (ESR) - CK (Creatine Kinase) - Baclofen 5 MG TABS; Take 1-2 tablets by mouth 3 (three) times daily.  Dispense: 60 tablet; Refill: 0  2. Cramp in limb  - Comprehensive metabolic panel - Magnesium - Baclofen 5 MG TABS; Take 1-2 tablets by mouth 3 (three) times daily.  Dispense: 60 tablet; Refill: 0  3. Hyperthyroidism On  methimazole.  - T4, free - TSH  4. Vitamin D deficiency  - VITAMIN D 25 Hydroxy (Vit-D Deficiency, Fractures)  5. Iron deficiency anemia, unspecified iron deficiency anemia type  - CBC - Ferritin       Lelon Huh, MD  [facility]

## 2017-11-24 LAB — T4, FREE: Free T4: 0.67 ng/dL — ABNORMAL LOW (ref 0.82–1.77)

## 2017-11-24 LAB — COMPREHENSIVE METABOLIC PANEL
ALBUMIN: 4 g/dL (ref 3.5–5.5)
ALT: 18 IU/L (ref 0–32)
AST: 22 IU/L (ref 0–40)
Albumin/Globulin Ratio: 1.3 (ref 1.2–2.2)
Alkaline Phosphatase: 140 IU/L — ABNORMAL HIGH (ref 39–117)
BUN / CREAT RATIO: 8 — AB (ref 9–23)
BUN: 7 mg/dL (ref 6–24)
Bilirubin Total: 0.2 mg/dL (ref 0.0–1.2)
CO2: 22 mmol/L (ref 20–29)
CREATININE: 0.9 mg/dL (ref 0.57–1.00)
Calcium: 9 mg/dL (ref 8.7–10.2)
Chloride: 106 mmol/L (ref 96–106)
GFR calc non Af Amer: 73 mL/min/{1.73_m2} (ref >59)
GFR, EST AFRICAN AMERICAN: 84 mL/min/{1.73_m2} (ref >59)
GLUCOSE: 96 mg/dL (ref 65–99)
Globulin, Total: 3.1 g/dL (ref 1.5–4.5)
Potassium: 4.7 mmol/L (ref 3.5–5.2)
Sodium: 141 mmol/L (ref 134–144)
TOTAL PROTEIN: 7.1 g/dL (ref 6.0–8.5)

## 2017-11-24 LAB — VITAMIN D 25 HYDROXY (VIT D DEFICIENCY, FRACTURES): Vit D, 25-Hydroxy: 13 ng/mL — ABNORMAL LOW (ref 30.0–100.0)

## 2017-11-24 LAB — TSH: TSH: 0.028 u[IU]/mL — AB (ref 0.450–4.500)

## 2017-11-24 LAB — CBC
HEMOGLOBIN: 13.5 g/dL (ref 11.1–15.9)
Hematocrit: 39.3 % (ref 34.0–46.6)
MCH: 30 pg (ref 26.6–33.0)
MCHC: 34.4 g/dL (ref 31.5–35.7)
MCV: 87 fL (ref 79–97)
Platelets: 250 10*3/uL (ref 150–379)
RBC: 4.5 x10E6/uL (ref 3.77–5.28)
RDW: 15.4 % (ref 12.3–15.4)
WBC: 6 10*3/uL (ref 3.4–10.8)

## 2017-11-24 LAB — FERRITIN: Ferritin: 19 ng/mL (ref 15–150)

## 2017-11-24 LAB — SEDIMENTATION RATE: SED RATE: 39 mm/h (ref 0–40)

## 2017-11-24 LAB — MAGNESIUM: MAGNESIUM: 2.1 mg/dL (ref 1.6–2.3)

## 2017-11-24 LAB — CK: CK TOTAL: 688 U/L — AB (ref 24–173)

## 2017-11-26 ENCOUNTER — Telehealth: Payer: Self-pay

## 2017-11-26 DIAGNOSIS — R748 Abnormal levels of other serum enzymes: Secondary | ICD-10-CM | POA: Insufficient documentation

## 2017-11-26 LAB — ANA: ANA TITER 1: NEGATIVE

## 2017-11-26 LAB — SPECIMEN STATUS REPORT

## 2017-11-26 LAB — CORTISOL: Cortisol: 10.3 ug/dL

## 2017-11-26 LAB — PROLACTIN: PROLACTIN: 10.8 ng/mL (ref 4.8–23.3)

## 2017-11-26 NOTE — Telephone Encounter (Signed)
-----   Message from Malva Limesonald E Fisher, MD sent at 11/26/2017  4:48 PM EDT ----- Labs show an elevated of creatinine kinase, which is a muscle enzymes. If not much better with muscle relaxer then she will need referral to a neurologist for further evaluation, as this could be an indication of a neuromuscular condition.

## 2017-11-26 NOTE — Telephone Encounter (Signed)
Patient advised she states that she stopped vitamin D and only took two muscle relaxer's and has been noticing some slight improvement. KW

## 2017-12-06 ENCOUNTER — Ambulatory Visit (INDEPENDENT_AMBULATORY_CARE_PROVIDER_SITE_OTHER): Payer: BLUE CROSS/BLUE SHIELD | Admitting: Family Medicine

## 2017-12-06 ENCOUNTER — Encounter: Payer: Self-pay | Admitting: Family Medicine

## 2017-12-06 VITALS — BP 110/70 | HR 79 | Temp 98.3°F | Resp 16 | Wt 237.0 lb

## 2017-12-06 DIAGNOSIS — R7989 Other specified abnormal findings of blood chemistry: Secondary | ICD-10-CM

## 2017-12-06 DIAGNOSIS — R748 Abnormal levels of other serum enzymes: Secondary | ICD-10-CM | POA: Diagnosis not present

## 2017-12-06 DIAGNOSIS — E0590 Thyrotoxicosis, unspecified without thyrotoxic crisis or storm: Secondary | ICD-10-CM

## 2017-12-06 DIAGNOSIS — M62838 Other muscle spasm: Secondary | ICD-10-CM

## 2017-12-06 MED ORDER — CYCLOBENZAPRINE HCL 5 MG PO TABS: 5.0000 mg | ORAL_TABLET | Freq: Three times a day (TID) | ORAL | 1 refills | Status: DC | PRN

## 2017-12-06 NOTE — Progress Notes (Signed)
Patient: Erin Macdonald Female    DOB: [birth year]   52 y.o.   MRN: [id number] Visit Date: 12/06/2017  Today's Provider: Mila Merry, MD   No chief complaint on file.  Subjective:    HPI  Patient presents for follow up persistent muscle cramping throughout all extremities. She was seen 11/23/2017 and prescribed Baclofen which she states provides very little relief. Labs were remarkable for elevated CK of 688 and low TSH and free T4. She had normal sed rates. She has been on current dose of methimazole for hyperthyroid for at least the last year. This was previously managed by Dr. Hyacinth Meeker.    No Known Allergies   Current Outpatient Medications:  .  methimazole (TAPAZOLE) 10 MG tablet, Take 1 tablet (10 mg total) by mouth daily., Disp: 90 tablet, Rfl: 1 .  metoprolol tartrate (LOPRESSOR) 25 MG tablet, Take 1 tablet (25 mg total) by mouth 2 (two) times daily., Disp: 180 tablet, Rfl: 1 .  Baclofen 5 MG TABS, Take 1-2 tablets by mouth 3 (three) times daily. (Patient not taking: Reported on 12/06/2017), Disp: 60 tablet, Rfl: 0 .  Vitamin D, Ergocalciferol, (DRISDOL) 50000 units CAPS capsule, Take 1 capsule (50,000 Units total) by mouth every 7 (seven) days. (Patient not taking: Reported on 12/06/2017), Disp: 12 capsule, Rfl: 1  Review of Systems  Constitutional: Negative for appetite change, chills, fatigue and fever.  Respiratory: Negative for chest tightness and shortness of breath.   Cardiovascular: Negative for chest pain and palpitations.  Gastrointestinal: Negative for abdominal pain, nausea and vomiting.  Neurological: Negative for dizziness and weakness.    Social History   Tobacco Use  . Smoking status: Never Smoker  . Smokeless tobacco: Never Used  Substance Use Topics  . Alcohol use: No   Objective:   BP 110/70 (BP Location: Right Arm, Patient Position: Sitting, Cuff Size: Large)   Pulse 79   Temp 98.3 F (36.8 C) (Oral)   Resp 16   Wt 237 lb (107.5 kg)    SpO2 97%   BMI 38.25 kg/m  Vitals:   12/06/17 0808  BP: 110/70  Pulse: 79  Resp: 16  Temp: 98.3 F (36.8 C)  TempSrc: Oral  SpO2: 97%  Weight: 237 lb (107.5 kg)     Physical Exam   General Appearance:    Alert, cooperative, no distress  Eyes:    PERRL, conjunctiva/corneas clear, EOM's intact       Lungs:     Clear to auscultation bilaterally, respirations unlabored  Heart:    Regular rate and rhythm  Neurologic:   Awake, alert, oriented x 3. No apparent focal neurological           defect. Somewhat restless due to muscle cramping.           Assessment & Plan:      1. Elevated CK Unclear if this is secondary to muscle spasm are sign of underlying neuromuscular pathology. Will refer neuro for further evaluation.  - Ambulatory referral to Neurology  2. Muscle spasm Minimal relief with baclofen. Change to  cyclobenzaprine (FLEXERIL) 5 MG tablet; Take 1-2 tablets (5-10 mg total) by mouth 3 (three) times daily as needed for muscle spasms.  Dispense: 60 tablet; Refill: 1 - Ambulatory referral to Neurology  3. Hyperthyroidism Currently on 10mg  methimazole daily. Unsure how to interpret low T4 and low TSH in this setting. She has not had follow up with endocrinology since Dr. Hyacinth Meeker stopped practicing.   -  Ambulatory referral to Endocrinology  4. Low TSH level  - Ambulatory referral to Endocrinology  5. Low serum T4 level  - Ambulatory referral to Endocrinology       Mila Merryonald Fisher, MD  [facility]

## 2017-12-06 NOTE — Patient Instructions (Signed)
   PLEASE BRING ALL OF YOUR MEDICATION BOTTLES TO EVERY APPOINTMENT TO MAKE SURE OUR MEDICATION LIST IS THE SAME AS YOURS  Medications Discontinued During This Encounter  Medication Reason  . Vitamin D, Ergocalciferol, (DRISDOL) 50000 units CAPS capsule Patient Preference  . Baclofen 5 MG TABS Change in therapy    Meds ordered this encounter  Medications  . cyclobenzaprine (FLEXERIL) 5 MG tablet    Sig: Take 1-2 tablets (5-10 mg total) by mouth 3 (three) times daily as needed for muscle spasms.    Dispense:  60 tablet    Refill:  1    Adult vaccines due  Topic Date Due  . TETANUS/TDAP  08/10/2018 (Originally 07/28/1983)    Health Maintenance Due  Topic Date Due  . COLONOSCOPY  07/27/2014

## 2017-12-14 DIAGNOSIS — M62838 Other muscle spasm: Secondary | ICD-10-CM | POA: Diagnosis not present

## 2017-12-20 DIAGNOSIS — G629 Polyneuropathy, unspecified: Secondary | ICD-10-CM | POA: Diagnosis not present

## 2017-12-20 DIAGNOSIS — R202 Paresthesia of skin: Secondary | ICD-10-CM | POA: Diagnosis not present

## 2017-12-20 DIAGNOSIS — R200 Anesthesia of skin: Secondary | ICD-10-CM | POA: Diagnosis not present

## 2017-12-20 DIAGNOSIS — G5601 Carpal tunnel syndrome, right upper limb: Secondary | ICD-10-CM | POA: Diagnosis not present

## 2017-12-27 ENCOUNTER — Telehealth: Payer: Self-pay | Admitting: Family Medicine

## 2017-12-27 DIAGNOSIS — Z1231 Encounter for screening mammogram for malignant neoplasm of breast: Secondary | ICD-10-CM

## 2017-12-27 NOTE — Telephone Encounter (Signed)
Pt stated that at her last OV with dr. Sherrie MustacheFisher she requested that he order her screening mammogram so she could have it done at work. Pt stated that they haven't received the order. Pt is requesting the order be faxed to FAX# [phone] Attention Lila Allmond and she needs it before 12/29/17. Please advise. Thanks TNP

## 2017-12-28 NOTE — Telephone Encounter (Signed)
Ok to send order? Please advise. Thanks!  

## 2017-12-30 NOTE — Telephone Encounter (Signed)
Please fax order screening mammogram as requested below.

## 2017-12-31 NOTE — Telephone Encounter (Signed)
Fax# below is wrong. Faxed order to number we used last year. [phone].

## 2018-01-06 DIAGNOSIS — E0500 Thyrotoxicosis with diffuse goiter without thyrotoxic crisis or storm: Secondary | ICD-10-CM | POA: Diagnosis not present

## 2018-01-06 DIAGNOSIS — E0590 Thyrotoxicosis, unspecified without thyrotoxic crisis or storm: Secondary | ICD-10-CM | POA: Diagnosis not present

## 2018-01-18 DIAGNOSIS — Z1231 Encounter for screening mammogram for malignant neoplasm of breast: Secondary | ICD-10-CM | POA: Diagnosis not present

## 2018-01-18 LAB — HM MAMMOGRAPHY

## 2018-01-20 ENCOUNTER — Encounter: Payer: Self-pay | Admitting: *Deleted

## 2018-01-21 ENCOUNTER — Other Ambulatory Visit: Payer: Self-pay | Admitting: Physician Assistant

## 2018-01-21 DIAGNOSIS — E0590 Thyrotoxicosis, unspecified without thyrotoxic crisis or storm: Secondary | ICD-10-CM

## 2018-01-27 NOTE — Telephone Encounter (Signed)
Pharmacy requesting refills.

## 2018-01-31 ENCOUNTER — Other Ambulatory Visit: Payer: Self-pay | Admitting: Physician Assistant

## 2018-01-31 DIAGNOSIS — I10 Essential (primary) hypertension: Secondary | ICD-10-CM

## 2018-02-19 ENCOUNTER — Other Ambulatory Visit: Payer: Self-pay | Admitting: Physician Assistant

## 2018-02-19 DIAGNOSIS — E559 Vitamin D deficiency, unspecified: Secondary | ICD-10-CM

## 2018-03-07 DIAGNOSIS — E0590 Thyrotoxicosis, unspecified without thyrotoxic crisis or storm: Secondary | ICD-10-CM | POA: Diagnosis not present

## 2018-03-14 DIAGNOSIS — E0500 Thyrotoxicosis with diffuse goiter without thyrotoxic crisis or storm: Secondary | ICD-10-CM | POA: Diagnosis not present

## 2018-04-09 ENCOUNTER — Telehealth: Payer: Self-pay | Admitting: Family Medicine

## 2018-04-09 DIAGNOSIS — M791 Myalgia, unspecified site: Secondary | ICD-10-CM

## 2018-04-09 DIAGNOSIS — R252 Cramp and spasm: Secondary | ICD-10-CM

## 2018-04-11 NOTE — Telephone Encounter (Signed)
Patient states she did not like the way the Flexeril made her feel, so she would like to restart the Baclofen. She states she is taking Baclofen once or twice per week.

## 2018-04-11 NOTE — Telephone Encounter (Signed)
At last o.v. Patient stated baclofen didn't work and we changed to cyclobenzaprine, is she sure she want baclofen refilled?

## 2018-04-15 MED ORDER — BACLOFEN 5 MG PO TABS: 1.0000 | ORAL_TABLET | Freq: Three times a day (TID) | ORAL | 2 refills | Status: DC | PRN

## 2018-04-15 NOTE — Telephone Encounter (Signed)
[facility]

## 2018-04-15 NOTE — Addendum Note (Signed)
Addended by: Malva LimesFISHER, Danish Ruffins E on: 04/15/2018 12:32 PM   Modules accepted: Orders

## 2018-04-15 NOTE — Telephone Encounter (Signed)
Pt states when she got to pharmacy the RX for 60 tablets was $50.  States she can not pay that much and noticed that the RX was 60 tablet supply and she is requesting a 10 tablet day [facility] [address] and the other RX cxed.

## 2018-05-17 DIAGNOSIS — E0500 Thyrotoxicosis with diffuse goiter without thyrotoxic crisis or storm: Secondary | ICD-10-CM | POA: Diagnosis not present

## 2018-05-24 DIAGNOSIS — E0500 Thyrotoxicosis with diffuse goiter without thyrotoxic crisis or storm: Secondary | ICD-10-CM | POA: Diagnosis not present

## 2018-08-15 ENCOUNTER — Other Ambulatory Visit: Payer: Self-pay | Admitting: Family Medicine

## 2018-08-15 DIAGNOSIS — I10 Essential (primary) hypertension: Secondary | ICD-10-CM

## 2018-09-28 DIAGNOSIS — E0500 Thyrotoxicosis with diffuse goiter without thyrotoxic crisis or storm: Secondary | ICD-10-CM | POA: Diagnosis not present

## 2019-01-10 DIAGNOSIS — E0500 Thyrotoxicosis with diffuse goiter without thyrotoxic crisis or storm: Secondary | ICD-10-CM | POA: Diagnosis not present

## 2019-02-16 ENCOUNTER — Other Ambulatory Visit: Payer: Self-pay

## 2019-02-16 DIAGNOSIS — I10 Essential (primary) hypertension: Secondary | ICD-10-CM

## 2019-02-16 MED ORDER — METOPROLOL TARTRATE 25 MG PO TABS
25.0000 mg | ORAL_TABLET | Freq: Two times a day (BID) | ORAL | 1 refills | Status: DC
Start: 2019-02-16 — End: 2019-09-04

## 2019-04-21 ENCOUNTER — Other Ambulatory Visit: Payer: Self-pay

## 2019-04-21 DIAGNOSIS — Z20822 Contact with and (suspected) exposure to covid-19: Secondary | ICD-10-CM

## 2019-04-22 LAB — NOVEL CORONAVIRUS, NAA: SARS-CoV-2, NAA: NOT DETECTED

## 2019-05-15 ENCOUNTER — Other Ambulatory Visit: Payer: Self-pay

## 2019-05-15 DIAGNOSIS — Z20822 Contact with and (suspected) exposure to covid-19: Secondary | ICD-10-CM

## 2019-05-17 LAB — NOVEL CORONAVIRUS, NAA: SARS-CoV-2, NAA: NOT DETECTED

## 2019-07-14 ENCOUNTER — Other Ambulatory Visit: Payer: Self-pay

## 2019-07-14 DIAGNOSIS — Z20822 Contact with and (suspected) exposure to covid-19: Secondary | ICD-10-CM

## 2019-07-15 LAB — NOVEL CORONAVIRUS, NAA: SARS-CoV-2, NAA: NOT DETECTED

## 2019-08-15 ENCOUNTER — Other Ambulatory Visit: Payer: Self-pay

## 2019-08-15 DIAGNOSIS — Z20822 Contact with and (suspected) exposure to covid-19: Secondary | ICD-10-CM

## 2019-08-17 LAB — NOVEL CORONAVIRUS, NAA: SARS-CoV-2, NAA: NOT DETECTED

## 2019-09-02 ENCOUNTER — Other Ambulatory Visit: Payer: Self-pay | Admitting: Family Medicine

## 2019-09-02 DIAGNOSIS — I10 Essential (primary) hypertension: Secondary | ICD-10-CM

## 2019-09-02 NOTE — Telephone Encounter (Signed)
Requested medication (s) are due for refill today:yes  Requested medication (s) are on the active medication list: yes  Last refill:  05/14/2019  Future visit scheduled: no  Notes to clinic:  LOV-12/06/2017 Overdue for office visit Review for refill   Requested Prescriptions  Pending Prescriptions Disp Refills   metoprolol tartrate (LOPRESSOR) 25 MG tablet [Pharmacy Med Name: METOPROLOL TARTRATE 25 MG TAB] 180 tablet 1    Sig: TAKE 1 TABLET BY MOUTH TWICE A DAY      Cardiovascular:  Beta Blockers Failed - 09/02/2019  1:16 AM      Failed - Valid encounter within last 6 months    Recent Outpatient Visits           1 year ago Elevated CK   [facility] Birdie Sons, MD   1 year ago [place], Donald E, MD   2 years ago Annual physical exam   [facility] Fenton Malling M, [place]   2 years ago Upper respiratory tract infection, unspecified type   [facility] Birdie Sons, MD   3 years ago Hyperthyroidism   [facility] Birdie Sons, MD              Passed - Last BP in normal range    BP Readings from Last 1 Encounters:  12/06/17 110/70          Passed - Last Heart Rate in normal range    Pulse Readings from Last 1 Encounters:  12/06/17 79

## 2019-09-06 ENCOUNTER — Ambulatory Visit: Payer: HRSA Program | Attending: Internal Medicine

## 2019-09-06 ENCOUNTER — Other Ambulatory Visit: Payer: Self-pay

## 2019-09-06 DIAGNOSIS — Z20828 Contact with and (suspected) exposure to other viral communicable diseases: Secondary | ICD-10-CM | POA: Insufficient documentation

## 2019-09-06 DIAGNOSIS — Z20822 Contact with and (suspected) exposure to covid-19: Secondary | ICD-10-CM

## 2019-09-07 LAB — NOVEL CORONAVIRUS, NAA

## 2019-09-11 ENCOUNTER — Ambulatory Visit: Payer: HRSA Program | Attending: Internal Medicine

## 2019-09-11 DIAGNOSIS — Z20828 Contact with and (suspected) exposure to other viral communicable diseases: Secondary | ICD-10-CM | POA: Insufficient documentation

## 2019-09-11 DIAGNOSIS — Z20822 Contact with and (suspected) exposure to covid-19: Secondary | ICD-10-CM

## 2019-09-13 LAB — NOVEL CORONAVIRUS, NAA: SARS-CoV-2, NAA: NOT DETECTED

## 2019-09-20 ENCOUNTER — Ambulatory Visit: Payer: Self-pay | Attending: Internal Medicine

## 2019-09-20 DIAGNOSIS — Z20822 Contact with and (suspected) exposure to covid-19: Secondary | ICD-10-CM | POA: Insufficient documentation

## 2019-09-21 LAB — NOVEL CORONAVIRUS, NAA: SARS-CoV-2, NAA: NOT DETECTED

## 2019-11-27 NOTE — Progress Notes (Signed)
Patient: Erin Macdonald   DOB: 1965/10/07   54 y.o. Female  MRN: [id number] Visit Date: 11/28/2019  Today's Provider: Lelon Huh, MD  Subjective:    Chief Complaint  Patient presents with  . Urinary Frequency   Urinary Frequency  This is a new problem. Episode onset: 2 weeks ago. The problem occurs every urination. The problem has been gradually improving. The patient is experiencing no pain. There has been no fever. Associated symptoms include frequency and urgency. Pertinent negatives include no chills, nausea or vomiting. She has tried increased fluids for the symptoms. The treatment provided mild relief.       Medications: Outpatient Medications Prior to Visit  Medication Sig Dispense Refill  . methimazole (TAPAZOLE) 10 MG tablet TAKE 1 TABLET BY MOUTH EVERY DAY 90 tablet 4  . metoprolol tartrate (LOPRESSOR) 25 MG tablet TAKE 1 TABLET BY MOUTH TWICE A DAY 180 tablet 0  . Baclofen 5 MG TABS Take 1-2 tablets by mouth 3 (three) times daily as needed. (Patient not taking: Reported on 11/28/2019) 10 tablet 2  . Vitamin D, Ergocalciferol, (DRISDOL) 50000 units CAPS capsule TAKE 1 CAPSULE (50,000 UNITS TOTAL) BY MOUTH EVERY 7 (SEVEN) DAYS. (Patient not taking: Reported on 11/28/2019) 12 capsule 1   No facility-administered medications prior to visit.      Review of Systems  Constitutional: Negative for appetite change, chills, fatigue and fever.  Respiratory: Negative for chest tightness and shortness of breath.   Cardiovascular: Negative for chest pain and palpitations.  Gastrointestinal: Negative for abdominal pain, nausea and vomiting.  Genitourinary: Positive for frequency and urgency.  Neurological: Negative for dizziness and weakness.      Objective:    BP (!) 153/83 (BP Location: Right Arm, Patient Position: Sitting, Cuff Size: Large)   Pulse 68   Temp (!) 97.3 F (36.3 C) (Temporal)   Ht 5\' 6"  (1.676 m)   Wt 255 lb 3.2 oz (115.8 kg)   BMI 41.19 kg/m      Physical Exam   General appearance: Obese female, cooperative and in no acute distress Head: Normocephalic, without obvious abnormality, atraumatic Respiratory: Respirations even and unlabored, normal respiratory rate Extremities: All extremities are intact.  Skin: Skin color, texture, turgor normal. No rashes seen  Psych: Appropriate mood and affect. Neurologic: Mental status: Alert, oriented to person, place, and time, thought content appropriate.  Results for orders placed or performed in visit on 11/28/19  POCT Urinalysis Dipstick  Result Value Ref Range   Color, UA dark yellow    Clarity, UA hazy    Glucose, UA Negative Negative   Bilirubin, UA negative    Ketones, UA negative    Spec Grav, UA >=1.030 (A) 1.010 - 1.025   Blood, UA nonhemolyzed trace    pH, UA 5.0 5.0 - 8.0   Protein, UA Negative Negative   Urobilinogen, UA 0.2 0.2 or 1.0 E.U./dL   Nitrite, UA negative    Leukocytes, UA Negative Negative   Appearance     Odor    POCT Glucose (CBG)  Result Value Ref Range   POC Glucose 100 (A) 70 - 99 mg/dl      Assessment & Plan:    1. Urinary frequency Unremarkable dip u/a. Check microscopy. Consider urology referral to rule out bladder emptying disorder - Urinalysis, microscopic only  2. Nocturia Counseled on possible third spacing during the day and to limit sodium intake and stay physically active.  - Urinalysis, microscopic only  3. Colon cancer screening  - Cologuard    The entirety of the information documented in the History of Present Illness, Review of Systems and Physical Exam were personally obtained by me. Portions of this information were initially documented by Awilda Bill, CMA and reviewed by me for thoroughness and accuracy.    Mila Merry, MD  [facility] [phone] (phone) [phone] (fax)  [facility]

## 2019-11-28 ENCOUNTER — Ambulatory Visit (INDEPENDENT_AMBULATORY_CARE_PROVIDER_SITE_OTHER): Payer: 59 | Admitting: Family Medicine

## 2019-11-28 ENCOUNTER — Encounter: Payer: Self-pay | Admitting: Family Medicine

## 2019-11-28 ENCOUNTER — Other Ambulatory Visit: Payer: Self-pay

## 2019-11-28 VITALS — BP 153/83 | HR 68 | Temp 97.3°F | Ht 66.0 in | Wt 255.2 lb

## 2019-11-28 DIAGNOSIS — R350 Frequency of micturition: Secondary | ICD-10-CM | POA: Diagnosis not present

## 2019-11-28 DIAGNOSIS — R351 Nocturia: Secondary | ICD-10-CM | POA: Diagnosis not present

## 2019-11-28 DIAGNOSIS — Z1211 Encounter for screening for malignant neoplasm of colon: Secondary | ICD-10-CM | POA: Diagnosis not present

## 2019-11-28 LAB — POCT URINALYSIS DIPSTICK
Bilirubin, UA: NEGATIVE
Glucose, UA: NEGATIVE
Ketones, UA: NEGATIVE
Leukocytes, UA: NEGATIVE
Nitrite, UA: NEGATIVE
Protein, UA: NEGATIVE
Spec Grav, UA: >=1.03 — AB (ref 1.010–1.025)
Urobilinogen, UA: 0.2 E.U./dL
pH, UA: 5 (ref 5.0–8.0)

## 2019-11-28 LAB — GLUCOSE, POCT (MANUAL RESULT ENTRY): POC Glucose: 100 mg/dl — AB (ref 70–99)

## 2019-11-28 NOTE — Patient Instructions (Addendum)
.   Please review the attached list of medications and notify my office if there are any errors.   Please call the [facility] at [facility] at [phone] to schedule your mammogram.

## 2019-11-29 ENCOUNTER — Ambulatory Visit: Payer: Self-pay | Admitting: Family Medicine

## 2019-11-29 LAB — URINALYSIS, MICROSCOPIC ONLY
Bacteria, UA: NONE SEEN
Casts: NONE SEEN /lpf
WBC, UA: NONE SEEN /hpf (ref 0–5)

## 2019-12-02 ENCOUNTER — Other Ambulatory Visit: Payer: Self-pay | Admitting: Family Medicine

## 2019-12-02 DIAGNOSIS — I10 Essential (primary) hypertension: Secondary | ICD-10-CM

## 2019-12-22 LAB — EXTERNAL GENERIC LAB PROCEDURE: COLOGUARD: NEGATIVE

## 2019-12-22 LAB — COLOGUARD
COLOGUARD: NEGATIVE
Cologuard: NEGATIVE

## 2020-04-09 ENCOUNTER — Other Ambulatory Visit: Payer: Self-pay | Admitting: Family Medicine

## 2020-04-09 DIAGNOSIS — I10 Essential (primary) hypertension: Secondary | ICD-10-CM

## 2020-04-09 MED ORDER — METOPROLOL TARTRATE 25 MG PO TABS: 25.0000 mg | ORAL_TABLET | Freq: Two times a day (BID) | ORAL | 0 refills | Status: DC

## 2020-04-09 NOTE — Telephone Encounter (Signed)
Medication Refill - Medication: Metoprolol  Has the patient contacted their pharmacy? Yes, but pharmacy was closed. Patient tried to request RX via MyChart but was unable too (Agent: If no, request that the patient contact the pharmacy for the refill.) (Agent: If yes, when and what did the pharmacy advise?)  Preferred Pharmacy (with phone number or street name): [facility] [address] [facility]  Agent: Please be advised that RX refills may take up to 3 business days. We ask that you follow-up with your pharmacy.

## 2020-04-23 ENCOUNTER — Encounter: Payer: Self-pay | Admitting: Physician Assistant

## 2020-04-23 ENCOUNTER — Ambulatory Visit (INDEPENDENT_AMBULATORY_CARE_PROVIDER_SITE_OTHER): Payer: 59 | Admitting: Physician Assistant

## 2020-04-23 ENCOUNTER — Other Ambulatory Visit: Payer: Self-pay

## 2020-04-23 VITALS — BP 146/84 | HR 73 | Temp 98.3°F | Wt 259.4 lb

## 2020-04-23 DIAGNOSIS — L089 Local infection of the skin and subcutaneous tissue, unspecified: Secondary | ICD-10-CM

## 2020-04-23 DIAGNOSIS — W57XXXA Bitten or stung by nonvenomous insect and other nonvenomous arthropods, initial encounter: Secondary | ICD-10-CM | POA: Diagnosis not present

## 2020-04-23 MED ORDER — TRIAMCINOLONE ACETONIDE 0.1 % EX CREA: 1.0000 "application " | TOPICAL_CREAM | Freq: Two times a day (BID) | CUTANEOUS | 0 refills | Status: AC

## 2020-04-23 NOTE — Patient Instructions (Signed)
Rash, Adult  A rash is a change in the color of your skin. A rash can also change the way your skin feels. There are many different conditions and factors that can cause a rash. Follow these instructions at home: The goal of treatment is to stop the itching and keep the rash from spreading. Watch for any changes in your symptoms. Let your doctor know about them. Follow these instructions to help with your condition: Medicine Take or apply over-the-counter and prescription medicines only as told by your doctor. These may include medicines:  To treat red or swollen skin (corticosteroid creams).  To treat itching.  To treat an allergy (oral antihistamines).  To treat very bad symptoms (oral corticosteroids).  Skin care  Put cool cloths (compresses) on the affected areas.  Do not scratch or rub your skin.  Avoid covering the rash. Make sure that the rash is exposed to air as much as possible. Managing itching and discomfort  Avoid hot showers or baths. These can make itching worse. A cold shower may help.  Try taking a bath with: ? Epsom salts. You can get these at your local pharmacy or grocery store. Follow the instructions on the package. ? Baking soda. Pour a small amount into the bath as told by your doctor. ? Colloidal oatmeal. You can get this at your local pharmacy or grocery store. Follow the instructions on the package.  Try putting baking soda paste onto your skin. Stir water into baking soda until it gets like a paste.  Try putting on a lotion that relieves itchiness (calamine lotion).  Keep cool and out of the sun. Sweating and being hot can make itching worse. General instructions   Rest as needed.  Drink enough fluid to keep your pee (urine) pale yellow.  Wear loose-fitting clothing.  Avoid scented soaps, detergents, and perfumes. Use gentle soaps, detergents, perfumes, and other cosmetic products.  Avoid anything that causes your rash. Keep a journal to  help track what causes your rash. Write down: ? What you eat. ? What cosmetic products you use. ? What you drink. ? What you wear. This includes jewelry.  Keep all follow-up visits as told by your doctor. This is important. Contact a doctor if:  You sweat at night.  You lose weight.  You pee (urinate) more than normal.  You pee less than normal, or you notice that your pee is a darker color than normal.  You feel weak.  You throw up (vomit).  Your skin or the whites of your eyes look yellow (jaundice).  Your skin: ? Tingles. ? Is numb.  Your rash: ? Does not go away after a few days. ? Gets worse.  You are: ? More thirsty than normal. ? More tired than normal.  You have: ? New symptoms. ? Pain in your belly (abdomen). ? A fever. ? Watery poop (diarrhea). Get help right away if:  You have a fever and your symptoms suddenly get worse.  You start to feel mixed up (confused).  You have a very bad headache or a stiff neck.  You have very bad joint pains or stiffness.  You have jerky movements that you cannot control (seizure).  Your rash covers all or most of your body. The rash may or may not be painful.  You have blisters that: ? Are on top of the rash. ? Grow larger. ? Grow together. ? Are painful. ? Are inside your nose or mouth.  You have a rash   that: ? Looks like purple pinprick-sized spots all over your body. ? Has a "bull's eye" or looks like a target. ? Is red and painful, causes your skin to peel, and is not from being in the sun too long. Summary  A rash is a change in the color of your skin. A rash can also change the way your skin feels.  The goal of treatment is to stop the itching and keep the rash from spreading.  Take or apply over-the-counter and prescription medicines only as told by your doctor.  Contact a doctor if you have new symptoms or symptoms that get worse.  Keep all follow-up visits as told by your doctor. This is  important. This information is not intended to replace advice given to you by your health care provider. Make sure you discuss any questions you have with your health care provider. Document Revised: 12/23/2018 Document Reviewed: 04/04/2018 Elsevier Patient Education  2020 Elsevier Inc.  

## 2020-04-23 NOTE — Progress Notes (Signed)
     Established patient visit   Patient: Erin Macdonald   DOB: 1965/09/16   54 y.o. Female  MRN: [id number] Visit Date: 04/23/2020  Today's healthcare provider: Trey Sailors, PA-C   Chief Complaint  Patient presents with  . Insect Bite  I,Erin Macdonald M Erin Macdonald,acting as a scribe for Erin Sailors, PA-C.,have documented all relevant documentation on the behalf of Erin Sailors, PA-C,as directed by  Erin Sailors, PA-C while in the presence of Erin Sailors, PA-C.  Subjective    HPI   Insect Bite Patient presents today for insect bite to her left ankle/ side foot since Saturday,04/20/2020. Patient reports the area is red, swollen and itchy. Patient states she applied cream and elevated her legs.     Medications: Outpatient Medications Prior to Visit  Medication Sig  . methimazole (TAPAZOLE) 10 MG tablet TAKE 1 TABLET BY MOUTH EVERY DAY  . metoprolol tartrate (LOPRESSOR) 25 MG tablet Take 1 tablet (25 mg total) by mouth 2 (two) times daily.   No facility-administered medications prior to visit.    Review of Systems  Constitutional: Negative.   Cardiovascular: Positive for leg swelling.  Hematological: Negative.       Objective    BP (!) 146/84 (BP Location: Left Arm, Patient Position: Sitting, Cuff Size: Large)   Pulse 73   Temp 98.3 F (36.8 C) (Oral)   Wt 259 lb 6.4 oz (117.7 kg)   BMI 41.87 kg/m    Physical Exam Constitutional:      Appearance: Normal appearance.  Musculoskeletal:     Right lower leg: No edema.     Left lower leg: Edema present.  Skin:    Findings: Rash present.       Neurological:     Mental Status: She is alert and oriented to person, place, and time. Mental status is at baseline.  Psychiatric:        Mood and Affect: Mood normal.        Behavior: Behavior normal.       No results found for any visits on 04/23/20.  Assessment & Plan    1. Pustule  - triamcinolone cream (KENALOG) 0.1 %; Apply 1 application  topically 2 (two) times daily.  Dispense: 30 g; Refill: 0  2. Bug bite, initial encounter     Return if symptoms worsen or fail to improve.      ITrey Sailors, PA-C, have reviewed all documentation for this visit. The documentation on 04/23/20 for the exam, diagnosis, procedures, and orders are all accurate and complete.    Erin Macdonald  [facility] [phone] (phone) [phone] (fax)  [facility]

## 2020-07-05 LAB — TSH: TSH: 3.76 (ref 0.41–5.90)

## 2020-08-26 ENCOUNTER — Telehealth: Payer: Self-pay

## 2020-08-26 NOTE — Telephone Encounter (Signed)
Not a pt of [facility] please route accordingly.   Copied from CRM [id number]. Topic: General - Other >> Aug 26, 2020  1:52 PM Gwenlyn Fudge wrote: Reason for CRM: Pt called stating that she has been receiving calls and messages from [place] regarding a Dort[place]. She states that no one by that name lives with her and is requesting to have the calls stopped. Please advise.

## 2020-10-01 ENCOUNTER — Other Ambulatory Visit: Payer: Self-pay | Admitting: Family Medicine

## 2020-10-01 DIAGNOSIS — I10 Essential (primary) hypertension: Secondary | ICD-10-CM

## 2020-11-03 ENCOUNTER — Other Ambulatory Visit: Payer: Self-pay | Admitting: Family Medicine

## 2020-11-03 DIAGNOSIS — I10 Essential (primary) hypertension: Secondary | ICD-10-CM

## 2020-11-03 NOTE — Telephone Encounter (Signed)
Requested medication (s) are due for refill today: Yes  Requested medication (s) are on the active medication list: Yes  Last refill:  10/01/20  Future visit scheduled: No  Notes to clinic:  Left pt. Message to call and make appointment.    Requested Prescriptions  Pending Prescriptions Disp Refills   metoprolol tartrate (LOPRESSOR) 25 MG tablet [Pharmacy Med Name: METOPROLOL TARTRATE 25 MG TAB] 60 tablet 0    Sig: TAKE 1 TABLET BY MOUTH TWICE A DAY      Cardiovascular:  Beta Blockers Failed - 11/03/2020 12:31 PM      Failed - Last BP in normal range    BP Readings from Last 1 Encounters:  04/23/20 (!) 146/84          Failed - Valid encounter within last 6 months    Recent Outpatient Visits           6 months ago [facility] Osvaldo Angst M, [place]   11 months ago Urinary frequency   [facility] Malva Limes, MD   2 years ago Elevated CK   [facility] Malva Limes, MD   2 years ago Myalgia   [facility] Malva Limes, MD   3 years ago Annual physical exam   [facility] Joycelyn Man M, [place]                Passed - Last Heart Rate in normal range    Pulse Readings from Last 1 Encounters:  04/23/20 73

## 2020-12-05 ENCOUNTER — Other Ambulatory Visit: Payer: Self-pay | Admitting: Family Medicine

## 2020-12-05 DIAGNOSIS — I10 Essential (primary) hypertension: Secondary | ICD-10-CM

## 2020-12-05 NOTE — Telephone Encounter (Signed)
Requested medication (s) are due for refill today: expired medication  Requested medication (s) are on the active medication list: yes  Last refill:  11/04/20 #60 0 refills  Future visit scheduled: no  Notes to clinic:  expired medication . Called patient to schedule appt. No answer, left voicemail to call clinic back for appt. Do you want to renew Rx?     Requested Prescriptions  Pending Prescriptions Disp Refills   metoprolol tartrate (LOPRESSOR) 25 MG tablet [Pharmacy Med Name: METOPROLOL TARTRATE 25 MG TAB] 60 tablet 0    Sig: TAKE 1 TABLET BY MOUTH TWICE A DAY      Cardiovascular:  Beta Blockers Failed - 12/05/2020 10:31 AM      Failed - Last BP in normal range    BP Readings from Last 1 Encounters:  04/23/20 (!) 146/84          Failed - Valid encounter within last 6 months    Recent Outpatient Visits           7 months ago [facility] [place], [place], [place]   1 year ago Urinary frequency   [facility] Malva Limes, MD   3 years ago Elevated CK   [facility] Malva Limes, MD   3 years ago Myalgia   [facility] Malva Limes, MD   3 years ago Annual physical exam   [facility] Joycelyn Man M, [place]                Passed - Last Heart Rate in normal range    Pulse Readings from Last 1 Encounters:  04/23/20 73

## 2020-12-17 ENCOUNTER — Other Ambulatory Visit: Payer: Self-pay

## 2020-12-17 ENCOUNTER — Ambulatory Visit (INDEPENDENT_AMBULATORY_CARE_PROVIDER_SITE_OTHER): Payer: BC Managed Care – PPO | Admitting: Family Medicine

## 2020-12-17 VITALS — BP 140/71 | HR 72 | Ht 66.0 in | Wt 256.0 lb

## 2020-12-17 DIAGNOSIS — E0590 Thyrotoxicosis, unspecified without thyrotoxic crisis or storm: Secondary | ICD-10-CM

## 2020-12-17 DIAGNOSIS — E559 Vitamin D deficiency, unspecified: Secondary | ICD-10-CM | POA: Diagnosis not present

## 2020-12-17 DIAGNOSIS — Z1239 Encounter for other screening for malignant neoplasm of breast: Secondary | ICD-10-CM

## 2020-12-17 DIAGNOSIS — I10 Essential (primary) hypertension: Secondary | ICD-10-CM | POA: Diagnosis not present

## 2020-12-17 DIAGNOSIS — Z1159 Encounter for screening for other viral diseases: Secondary | ICD-10-CM

## 2020-12-17 DIAGNOSIS — Z124 Encounter for screening for malignant neoplasm of cervix: Secondary | ICD-10-CM

## 2020-12-17 NOTE — Progress Notes (Signed)
Established patient visit   Patient: Erin Macdonald   DOB: [birth year]   55 y.o. Female  MRN: [id number] Visit Date: 12/17/2020  Today's healthcare provider: Mila Merry, MD   Chief Complaint  Patient presents with  . Hypertension   Subjective    HPI  Hypertension, follow-up  BP Readings from Last 3 Encounters:  04/23/20 (!) 146/84  11/28/19 (!) 153/83  12/06/17 110/70   Wt Readings from Last 3 Encounters:  04/23/20 259 lb 6.4 oz (117.7 kg)  11/28/19 255 lb 3.2 oz (115.8 kg)  12/06/17 237 lb (107.5 kg)     She was last seen for hypertension over 1 year ago.   She reports excellent compliance with treatment. She is not having side effects.  She is following a healthy diet. Patient is on weight loss challenge and has been avoiding soft drinks, fried foods, and incorporating more veggies. She is exercising. She has been doing body weight squats daily. She does not smoke.  Use of agents associated with hypertension: none.     Outside blood pressures are n/a. Symptoms: No chest pain No chest pressure  No palpitations No syncope  No dyspnea No orthopnea  No paroxysmal nocturnal dyspnea No lower extremity edema   Pertinent labs: Lab Results  Component Value Date   CHOL 112 07/19/2017   HDL 54 07/19/2017   LDLCALC 48 07/19/2017   TRIG 48 07/19/2017   Lab Results  Component Value Date   NA 141 11/23/2017   K 4.7 11/23/2017   CREATININE 0.90 11/23/2017   GFRNONAA 73 11/23/2017   GFRAA 84 11/23/2017   GLUCOSE 96 11/23/2017     The ASCVD Risk score (Goff DC Jr., et al., 2013) failed to calculate for the following reasons:   Cannot find a previous HDL lab   Cannot find a previous total cholesterol lab   ---------------------------------------------------------------------------------------------------      Medications: Outpatient Medications Prior to Visit  Medication Sig  . methimazole (TAPAZOLE) 10 MG tablet TAKE 1 TABLET BY MOUTH EVERY DAY  .  metoprolol tartrate (LOPRESSOR) 25 MG tablet TAKE 1 TABLET BY MOUTH TWICE A DAY  . triamcinolone cream (KENALOG) 0.1 % Apply 1 application topically 2 (two) times daily.   No facility-administered medications prior to visit.       Objective    BP 140/71 (BP Location: Right Arm, Patient Position: Sitting, Cuff Size: Normal)   Pulse 72   Ht 5\' 6"  (1.676 m)   Wt 256 lb (116.1 kg)   SpO2 100%   BMI 41.32 kg/m     Physical Exam    General: Appearance:    Obese female in no acute distress  Eyes:    PERRL, conjunctiva/corneas clear, EOM's intact       Lungs:     Clear to auscultation bilaterally, respirations unlabored  Heart:    Normal heart rate. Normal rhythm.  1/6  MS:   All extremities are intact.   Neurologic:   Awake, alert, oriented x 3. No apparent focal neurological           defect.         Assessment & Plan     1. Primary hypertension Doing well with metoprolol, but SBP borderline. She did recently start losing weight on new diet which she plans to continue. - CBC - Comprehensive metabolic panel - Lipid panel Continue current dose of metoprolol. Referring to gyn for well women exam and will see if BP is doing  better by then.   2. Vitamin D deficiency  - VITAMIN D 25 Hydroxy (Vit-D Deficiency, Fractures)  3. Morbid obesity (HCC) On weight loss challenge and encouraged to continue healthy diet and regular exercise.   4. Hyperthyroidism Followed by Dr. Reuel Derby  Lab Results  Component Value Date   TSH 3.76 07/05/2020     5. Encounter for screening for malignant neoplasm of breast, unspecified screening modality  - Ambulatory referral to Obstetrics / Gynecology  6. Cervical cancer screening Long overdue for routine screening.  - Ambulatory referral to Obstetrics / Gynecology        The entirety of the information documented in the History of Present Illness, Review of Systems and Physical Exam were personally obtained by me. Portions of this information  were initially documented by the CMA and reviewed by me for thoroughness and accuracy.      Mila Merry, MD  [facility] [phone] (phone) [phone] (fax)  [facility]

## 2020-12-17 NOTE — Patient Instructions (Addendum)
Please go to the lab draw station in [address] on the second floor of [facility]  when you are fasting for 8 hours. Normal hours are 8:00am to 11:30am and 1:00pm to 4:00pm Monday through Friday  

## 2020-12-18 ENCOUNTER — Telehealth: Payer: Self-pay

## 2020-12-18 NOTE — Telephone Encounter (Signed)
[facility] referring for Routine pap/breast/. Called and left voicemail for patient to call back to be scheduled.

## 2020-12-20 NOTE — Telephone Encounter (Signed)
Called and left voicemail for patient to call back to be scheduled. 

## 2020-12-23 NOTE — Telephone Encounter (Signed)
Called and left voicemail for patient to call back to be scheduled. 

## 2020-12-24 NOTE — Telephone Encounter (Signed)
Called and left voicemail for patient to call back to be scheduled. 

## 2020-12-25 ENCOUNTER — Encounter: Payer: Self-pay | Admitting: Family Medicine

## 2021-01-01 DIAGNOSIS — I10 Essential (primary) hypertension: Secondary | ICD-10-CM | POA: Diagnosis not present

## 2021-01-01 DIAGNOSIS — E559 Vitamin D deficiency, unspecified: Secondary | ICD-10-CM | POA: Diagnosis not present

## 2021-01-02 LAB — LIPID PANEL
Chol/HDL Ratio: 2.8 ratio (ref 0.0–4.4)
Cholesterol, Total: 156 mg/dL (ref 100–199)
HDL: 56 mg/dL (ref >39)
LDL Chol Calc (NIH): 86 mg/dL (ref 0–99)
Triglycerides: 72 mg/dL (ref 0–149)
VLDL Cholesterol Cal: 14 mg/dL (ref 5–40)

## 2021-01-02 LAB — COMPREHENSIVE METABOLIC PANEL
ALT: 14 IU/L (ref 0–32)
AST: 16 IU/L (ref 0–40)
Albumin/Globulin Ratio: 1.4 (ref 1.2–2.2)
Albumin: 4.2 g/dL (ref 3.8–4.9)
Alkaline Phosphatase: 84 IU/L (ref 44–121)
BUN/Creatinine Ratio: 11 (ref 9–23)
BUN: 9 mg/dL (ref 6–24)
Bilirubin Total: 0.3 mg/dL (ref 0.0–1.2)
CO2: 22 mmol/L (ref 20–29)
Calcium: 8.9 mg/dL (ref 8.7–10.2)
Chloride: 103 mmol/L (ref 96–106)
Creatinine, Ser: 0.84 mg/dL (ref 0.57–1.00)
Globulin, Total: 3 g/dL (ref 1.5–4.5)
Glucose: 89 mg/dL (ref 65–99)
Potassium: 4.2 mmol/L (ref 3.5–5.2)
Sodium: 142 mmol/L (ref 134–144)
Total Protein: 7.2 g/dL (ref 6.0–8.5)
eGFR: 82 mL/min/{1.73_m2} (ref >59)

## 2021-01-02 LAB — CBC
Hematocrit: 37.4 % (ref 34.0–46.6)
Hemoglobin: 12.9 g/dL (ref 11.1–15.9)
MCH: 30.8 pg (ref 26.6–33.0)
MCHC: 34.5 g/dL (ref 31.5–35.7)
MCV: 89 fL (ref 79–97)
Platelets: 254 10*3/uL (ref 150–450)
RBC: 4.19 x10E6/uL (ref 3.77–5.28)
RDW: 12.5 % (ref 11.7–15.4)
WBC: 9.1 10*3/uL (ref 3.4–10.8)

## 2021-01-02 LAB — VITAMIN D 25 HYDROXY (VIT D DEFICIENCY, FRACTURES): Vit D, 25-Hydroxy: 6.4 ng/mL — ABNORMAL LOW (ref 30.0–100.0)

## 2021-01-09 ENCOUNTER — Other Ambulatory Visit: Payer: Self-pay | Admitting: Family Medicine

## 2021-01-09 DIAGNOSIS — I10 Essential (primary) hypertension: Secondary | ICD-10-CM

## 2021-01-17 DIAGNOSIS — E0500 Thyrotoxicosis with diffuse goiter without thyrotoxic crisis or storm: Secondary | ICD-10-CM | POA: Diagnosis not present

## 2021-01-22 DIAGNOSIS — E0500 Thyrotoxicosis with diffuse goiter without thyrotoxic crisis or storm: Secondary | ICD-10-CM | POA: Diagnosis not present

## 2021-02-02 ENCOUNTER — Other Ambulatory Visit: Payer: Self-pay | Admitting: Family Medicine

## 2021-02-02 DIAGNOSIS — I10 Essential (primary) hypertension: Secondary | ICD-10-CM

## 2021-02-02 NOTE — Telephone Encounter (Signed)
Requested Prescriptions  Pending Prescriptions Disp Refills  . metoprolol tartrate (LOPRESSOR) 25 MG tablet [Pharmacy Med Name: METOPROLOL TARTRATE 25 MG TAB] 180 tablet 1    Sig: TAKE 1 TABLET BY MOUTH TWICE A DAY     Cardiovascular:  Beta Blockers Failed - 02/02/2021 12:31 PM      Failed - Last BP in normal range    BP Readings from Last 1 Encounters:  12/17/20 140/71         Passed - Last Heart Rate in normal range    Pulse Readings from Last 1 Encounters:  12/17/20 72         Passed - Valid encounter within last 6 months    Recent Outpatient Visits          1 month ago Primary hypertension   [facility] Malva Limes, MD   9 months ago [facility] [place], [place], [place]   1 year ago Urinary frequency   [facility] Malva Limes, MD   3 years ago Elevated CK   [facility] Malva Limes, MD   3 years ago Myalgia   [facility] Malva Limes, MD

## 2021-03-19 ENCOUNTER — Telehealth: Payer: Self-pay | Admitting: Family Medicine

## 2021-03-19 DIAGNOSIS — E559 Vitamin D deficiency, unspecified: Secondary | ICD-10-CM

## 2021-03-19 NOTE — Telephone Encounter (Signed)
Please advise patient it is time to check vitamin D levels since it was low when checked in april. Please print order and leave at lab.  Please verify if and how much vitamin D supplement she is taking. Thanks!

## 2021-03-19 NOTE — Telephone Encounter (Signed)
LMTCB 03/19/2021.  PEC please advise pt she is due for lab work.   Thanks,   -Vernona Rieger

## 2021-03-26 NOTE — Telephone Encounter (Signed)
Pt advised.  She is taking 500 Units a day.  She is going to come by one day next week.     Thanks,   -Vernona Rieger

## 2021-03-26 NOTE — Telephone Encounter (Signed)
Pt called back and requested to speak with Vernona Rieger again.

## 2021-03-26 NOTE — Telephone Encounter (Signed)
Called Ms. Arterberry back.  She is taking 5,000 units of Vitamin D daily.   Thanks,    Vernona Rieger

## 2021-07-12 ENCOUNTER — Telehealth: Payer: Self-pay | Admitting: Family Medicine

## 2021-07-12 DIAGNOSIS — I10 Essential (primary) hypertension: Secondary | ICD-10-CM

## 2021-07-12 NOTE — Telephone Encounter (Signed)
Sent e-mail to [employer], [employer] about pt concern.

## 2021-07-12 NOTE — Telephone Encounter (Signed)
Called pt and after pt gave DOB- pt stated that no one usually calls on the weekend and "it's a little fishy." Pt refused any further details.  Routing to office. Please call this pt an let her know I work on the weekend and I was trying to make her an appointment. If it doesn't come from the office, she may still be suspicious.

## 2021-07-12 NOTE — Telephone Encounter (Signed)
Sent pt MyChart message. Please reach out to her to alleviate her fears.

## 2021-07-14 ENCOUNTER — Encounter: Payer: Self-pay | Admitting: Family Medicine

## 2021-07-14 NOTE — Telephone Encounter (Signed)
Pt called back. Gave pt info regarding medication and that seems to have sufficed. Please advise.

## 2021-07-30 DIAGNOSIS — E0500 Thyrotoxicosis with diffuse goiter without thyrotoxic crisis or storm: Secondary | ICD-10-CM | POA: Diagnosis not present

## 2022-01-17 ENCOUNTER — Other Ambulatory Visit: Payer: Self-pay | Admitting: Family Medicine

## 2022-01-17 DIAGNOSIS — I10 Essential (primary) hypertension: Secondary | ICD-10-CM

## 2022-04-23 ENCOUNTER — Other Ambulatory Visit: Payer: Self-pay | Admitting: Family Medicine

## 2022-04-23 DIAGNOSIS — I10 Essential (primary) hypertension: Secondary | ICD-10-CM

## 2022-05-25 ENCOUNTER — Other Ambulatory Visit: Payer: Self-pay | Admitting: Family Medicine

## 2022-05-25 DIAGNOSIS — I10 Essential (primary) hypertension: Secondary | ICD-10-CM

## 2022-06-23 ENCOUNTER — Other Ambulatory Visit: Payer: Self-pay | Admitting: Family Medicine

## 2022-06-23 DIAGNOSIS — I10 Essential (primary) hypertension: Secondary | ICD-10-CM

## 2022-07-06 DIAGNOSIS — E0500 Thyrotoxicosis with diffuse goiter without thyrotoxic crisis or storm: Secondary | ICD-10-CM | POA: Diagnosis not present

## 2022-07-07 ENCOUNTER — Other Ambulatory Visit: Payer: Self-pay | Admitting: Family Medicine

## 2022-07-07 DIAGNOSIS — I10 Essential (primary) hypertension: Secondary | ICD-10-CM

## 2022-07-08 DIAGNOSIS — E0500 Thyrotoxicosis with diffuse goiter without thyrotoxic crisis or storm: Secondary | ICD-10-CM | POA: Diagnosis not present

## 2022-11-03 ENCOUNTER — Encounter: Payer: Self-pay | Admitting: Family Medicine

## 2022-11-03 ENCOUNTER — Other Ambulatory Visit: Payer: Self-pay | Admitting: Family Medicine

## 2022-11-03 DIAGNOSIS — I10 Essential (primary) hypertension: Secondary | ICD-10-CM

## 2022-11-03 MED ORDER — METOPROLOL TARTRATE 25 MG PO TABS: 25.0000 mg | ORAL_TABLET | Freq: Two times a day (BID) | ORAL | 0 refills | Status: DC

## 2022-11-04 ENCOUNTER — Other Ambulatory Visit: Payer: Self-pay

## 2022-11-04 DIAGNOSIS — I10 Essential (primary) hypertension: Secondary | ICD-10-CM

## 2022-11-04 MED ORDER — METOPROLOL TARTRATE 25 MG PO TABS: 25.0000 mg | ORAL_TABLET | Freq: Two times a day (BID) | ORAL | 0 refills | Status: DC

## 2022-11-25 ENCOUNTER — Ambulatory Visit: Payer: BC Managed Care – PPO | Admitting: Family Medicine

## 2022-11-25 NOTE — Progress Notes (Deleted)
   Argentina Ponder Tamella Tuccillo,acting as a scribe for Lelon Huh, MD.,have documented all relevant documentation on the behalf of Lelon Huh, MD,as directed by  Lelon Huh, MD while in the presence of Lelon Huh, MD.     Established patient visit   Patient: Erin Macdonald   DOB: 12-24-1965   57 y.o. Female  MRN: [id number] Visit Date: 11/25/2022  Today's healthcare provider: Lelon Huh, MD   No chief complaint on file.  Subjective    HPI  Hypertension, follow-up  BP Readings from Last 3 Encounters:  12/17/20 140/71  04/23/20 (!) 146/84  11/28/19 (!) 153/83   Wt Readings from Last 3 Encounters:  12/17/20 256 lb (116.1 kg)  04/23/20 259 lb 6.4 oz (117.7 kg)  11/28/19 255 lb 3.2 oz (115.8 kg)     She was last seen for hypertension 2 years ago.  BP at that visit was as above. Management since that visit includes no known changes.  She reports {excellent/good/fair/poor:19665} compliance with treatment. She {is/is not:9024} having side effects. {document side effects if present:1} She is following a {diet:21022986} diet. She {is/is not:9024} exercising. She {does/does not:200015} smoke.  Use of agents associated with hypertension: {bp agents assoc with hypertension:511::"none"}.   Outside blood pressures are {***enter patient reported home BP readings, or 'not being checked':1}. Symptoms: {Yes/No:20286} chest pain {Yes/No:20286} chest pressure  {Yes/No:20286} palpitations {Yes/No:20286} syncope  {Yes/No:20286} dyspnea {Yes/No:20286} orthopnea  {Yes/No:20286} paroxysmal nocturnal dyspnea {Yes/No:20286} lower extremity edema   Pertinent labs Lab Results  Component Value Date   CHOL 156 01/01/2021   HDL 56 01/01/2021   LDLCALC 86 01/01/2021   TRIG 72 01/01/2021   CHOLHDL 2.8 01/01/2021   Lab Results  Component Value Date   NA 142 01/01/2021   K 4.2 01/01/2021   CREATININE 0.84 01/01/2021   EGFR 82 01/01/2021   GLUCOSE 89 01/01/2021   TSH 3.76 07/05/2020      The 10-year ASCVD risk score (Arnett DK, et al., 2019) is: 5%  ---------------------------------------------------------------------------------------------------   Medications: Outpatient Medications Prior to Visit  Medication Sig   Cholecalciferol (VITAMIN D3) 125 MCG (5000 UT) CAPS Take 5,000 Units by mouth daily.   methimazole (TAPAZOLE) 10 MG tablet TAKE 1 TABLET BY MOUTH EVERY DAY   metoprolol tartrate (LOPRESSOR) 25 MG tablet Take 1 tablet (25 mg total) by mouth 2 (two) times daily.   triamcinolone cream (KENALOG) 0.1 % Apply 1 application topically 2 (two) times daily.   No facility-administered medications prior to visit.    Review of Systems  {Labs  Heme  Chem  Endocrine  Serology  Results Review (optional):23779}   Objective    There were no vitals taken for this visit. {Show previous vital signs (optional):23777}  Physical Exam  ***  No results found for any visits on 11/25/22.  Assessment & Plan     ***  No follow-ups on file.      {provider attestation***:1}   Lelon Huh, MD  [place] [phone] (phone) [phone] (fax)  [place]

## 2022-12-11 ENCOUNTER — Other Ambulatory Visit: Payer: Self-pay | Admitting: Family Medicine

## 2022-12-11 DIAGNOSIS — I10 Essential (primary) hypertension: Secondary | ICD-10-CM

## 2022-12-11 NOTE — Telephone Encounter (Signed)
Requested medication (s) are due for refill today: yes  Requested medication (s) are on the active medication list: yes  Last refill:  11/04/22  Future visit scheduled: yes  Notes to clinic:  : [place]. DX Code Needed.      Requested Prescriptions  Pending Prescriptions Disp Refills   metoprolol tartrate (LOPRESSOR) 25 MG tablet [Pharmacy Med Name: METOPROLOL TARTRATE 25 MG TAB] 180 tablet 1    Sig: TAKE 1 TABLET BY MOUTH TWICE A DAY     Cardiovascular:  Beta Blockers Failed - 12/11/2022  1:32 PM      Failed - Last BP in normal range    BP Readings from Last 1 Encounters:  12/17/20 140/71         Failed - Valid encounter within last 6 months    Recent Outpatient Visits           1 year ago Primary hypertension   [place], Donald E, MD   2 years ago [place], [place], [place]   3 years ago Urinary frequency   [place] Birdie Sons, MD   5 years ago Elevated CK   [facility] Birdie Sons, MD   5 years ago [place], MD              Passed - Last Heart Rate in normal range    Pulse Readings from Last 1 Encounters:  12/17/20 72

## 2022-12-15 ENCOUNTER — Other Ambulatory Visit: Payer: Self-pay | Admitting: Family Medicine

## 2022-12-15 DIAGNOSIS — I10 Essential (primary) hypertension: Secondary | ICD-10-CM

## 2022-12-15 NOTE — Telephone Encounter (Signed)
Called patient to schedule appt for medication refills. No answer, LVMTCB #[phone]. 

## 2022-12-15 NOTE — Telephone Encounter (Signed)
Requested medication (s) are due for refill today: yes   Requested medication (s) are on the active medication list: yes   Last refill:  11/04/22 #60 0 refills  Future visit scheduled: no   Notes to clinic:  last OV 12/17/20. Called patient to schedule appt for medication refills . No answer, LVMTCB. Protocol failed do you want to refill Rx?     Requested Prescriptions  Pending Prescriptions Disp Refills   metoprolol tartrate (LOPRESSOR) 25 MG tablet [Pharmacy Med Name: METOPROLOL TARTRATE 25 MG TAB] 60 tablet 0    Sig: TAKE 1 TABLET BY MOUTH TWICE A DAY     Cardiovascular:  Beta Blockers Failed - 12/15/2022 11:08 AM      Failed - Last BP in normal range    BP Readings from Last 1 Encounters:  12/17/20 140/71         Failed - Valid encounter within last 6 months    Recent Outpatient Visits           1 year ago Primary hypertension   [place], Donald E, MD   2 years ago [place], [place], [place]   3 years ago Urinary frequency   [place] Birdie Sons, MD   5 years ago Elevated CK   [facility] Birdie Sons, MD   5 years ago [place], MD              Passed - Last Heart Rate in normal range    Pulse Readings from Last 1 Encounters:  12/17/20 72

## 2023-01-19 ENCOUNTER — Other Ambulatory Visit: Payer: Self-pay | Admitting: Family Medicine

## 2023-01-19 DIAGNOSIS — I10 Essential (primary) hypertension: Secondary | ICD-10-CM

## 2023-03-08 ENCOUNTER — Other Ambulatory Visit: Payer: Self-pay | Admitting: Family Medicine

## 2023-03-08 DIAGNOSIS — I10 Essential (primary) hypertension: Secondary | ICD-10-CM

## 2023-03-08 NOTE — Telephone Encounter (Signed)
Medication Refill - Medication: Metoprolol Tartrate 25 mg Pt now has an appt for 07/01, please refill if possible  Has the patient contacted their pharmacy? yes (Agent: If no, request that the patient contact the pharmacy for the refill. If patient does not wish to contact the pharmacy document the reason why and proceed with request.) (Agent: If yes, when and what did the pharmacy advise?)contact pcp  Preferred Pharmacy (with phone number or street name):  [id number] - [place], [place] [address] Phone: [phone]  Fax: [phone]     Has the patient been seen for an appointment in the last year OR does the patient have an upcoming appointment? yes  Agent: Please be advised that RX refills may take up to 3 business days. We ask that you follow-up with your pharmacy.

## 2023-03-09 NOTE — Telephone Encounter (Signed)
Requested medication (s) are due for refill today: Yes  Requested medication (s) are on the active medication list: Yes  Last refill:  01/19/23  Future visit scheduled: Yes  Notes to clinic:  Unable to refill per protocol, appointment needed.      Requested Prescriptions  Pending Prescriptions Disp Refills   metoprolol tartrate (LOPRESSOR) 25 MG tablet 20 tablet 0    Sig: Take 1 tablet (25 mg total) by mouth 2 (two) times daily.     Cardiovascular:  Beta Blockers Failed - 03/08/2023  2:37 PM      Failed - Last BP in normal range    BP Readings from Last 1 Encounters:  12/17/20 140/71         Failed - Valid encounter within last 6 months    Recent Outpatient Visits           2 years ago Primary hypertension   [facility] Malva Limes, MD   2 years ago Pustule   [facility] [place], Ricki Rodriguez [place], [place]   3 years ago Urinary frequency   [place] [facility] Malva Limes, MD   5 years ago Elevated CK   [facility] Malva Limes, MD   5 years ago Myalgia   [place] [facility] Malva Limes, MD       Future Appointments             In 6 days Sherrie Mustache, Demetrios Isaacs, MD [facility]            Passed - Last Heart Rate in normal range    Pulse Readings from Last 1 Encounters:  12/17/20 72

## 2023-03-10 MED ORDER — METOPROLOL TARTRATE 25 MG PO TABS
25.0000 mg | ORAL_TABLET | Freq: Two times a day (BID) | ORAL | 0 refills | Status: DC
Start: 2023-03-10 — End: 2023-03-15

## 2023-03-15 ENCOUNTER — Telehealth (INDEPENDENT_AMBULATORY_CARE_PROVIDER_SITE_OTHER): Payer: BC Managed Care – PPO | Admitting: Family Medicine

## 2023-03-15 ENCOUNTER — Other Ambulatory Visit: Payer: Self-pay | Admitting: *Deleted

## 2023-03-15 DIAGNOSIS — Z91199 Patient's noncompliance with other medical treatment and regimen due to unspecified reason: Secondary | ICD-10-CM

## 2023-03-15 DIAGNOSIS — I10 Essential (primary) hypertension: Secondary | ICD-10-CM

## 2023-03-15 MED ORDER — METOPROLOL TARTRATE 25 MG PO TABS
25.0000 mg | ORAL_TABLET | Freq: Two times a day (BID) | ORAL | 0 refills | Status: DC
Start: 2023-03-15 — End: 2023-04-21

## 2023-03-19 DIAGNOSIS — E0500 Thyrotoxicosis with diffuse goiter without thyrotoxic crisis or storm: Secondary | ICD-10-CM | POA: Diagnosis not present

## 2023-03-22 DIAGNOSIS — E0500 Thyrotoxicosis with diffuse goiter without thyrotoxic crisis or storm: Secondary | ICD-10-CM | POA: Diagnosis not present

## 2023-03-24 ENCOUNTER — Ambulatory Visit: Payer: BC Managed Care – PPO | Admitting: Family Medicine

## 2023-03-24 DIAGNOSIS — E559 Vitamin D deficiency, unspecified: Secondary | ICD-10-CM

## 2023-03-24 DIAGNOSIS — E0590 Thyrotoxicosis, unspecified without thyrotoxic crisis or storm: Secondary | ICD-10-CM

## 2023-03-24 DIAGNOSIS — R739 Hyperglycemia, unspecified: Secondary | ICD-10-CM

## 2023-03-24 DIAGNOSIS — I10 Essential (primary) hypertension: Secondary | ICD-10-CM

## 2023-03-26 NOTE — Progress Notes (Signed)
Appt cancelled, no visit.  

## 2023-04-08 DIAGNOSIS — J3489 Other specified disorders of nose and nasal sinuses: Secondary | ICD-10-CM | POA: Diagnosis not present

## 2023-04-09 ENCOUNTER — Ambulatory Visit: Payer: BC Managed Care – PPO | Admitting: Family Medicine

## 2023-04-12 ENCOUNTER — Ambulatory Visit: Payer: Self-pay

## 2023-04-12 NOTE — Telephone Encounter (Signed)
    Chief Complaint: Dizziness, comes and goes. On antibiotic for sinus issues.  Symptoms: Above Frequency: Last week Pertinent Negatives: Patient denies fever Disposition: [] ED /[] Urgent Care (no appt availability in office) / [x] Appointment(In office/virtual)/ []  [facility]/ [] Home Care/ [] Refused Recommended Disposition /[] [place] [facility]/ []  Follow-up with PCP Additional Notes: Pt. Agrees with appointment. Instructed to call back for worsening of symptoms.  Reason for Disposition  [1] MODERATE dizziness (e.g., interferes with normal activities) AND [2] has NOT been evaluated by doctor (or NP/PA) for this  (Exception: Dizziness caused by heat exposure, sudden standing, or poor fluid intake.)  Answer Assessment - Initial Assessment Questions 1. DESCRIPTION: "Describe your dizziness."     Dizzy 2. LIGHTHEADED: "Do you feel lightheaded?" (e.g., somewhat faint, woozy, weak upon standing)     Faint 3. VERTIGO: "Do you feel like either you or the room is spinning or tilting?" (i.e. vertigo)     No 4. SEVERITY: "How bad is it?"  "Do you feel like you are going to faint?" "Can you stand and walk?"   - MILD: Feels slightly dizzy, but walking normally.   - MODERATE: Feels unsteady when walking, but not falling; interferes with normal activities (e.g., school, work).   - SEVERE: Unable to walk without falling, or requires assistance to walk without falling; feels like passing out now.      Mild 5. ONSET:  "When did the dizziness begin?"     Last week 6. AGGRAVATING FACTORS: "Does anything make it worse?" (e.g., standing, change in head position)     Comes and goes 7. HEART RATE: "Can you tell me your heart rate?" "How many beats in 15 seconds?"  (Note: not all patients can do this)       No 8. CAUSE: "What do you think is causing the dizziness?"     Unsure 9. RECURRENT SYMPTOM: "Have you had dizziness before?" If Yes, ask: "When was the last time?" "What happened that  time?"     No 10. OTHER SYMPTOMS: "Do you have any other symptoms?" (e.g., fever, chest pain, vomiting, diarrhea, bleeding)       No 11. PREGNANCY: "Is there any chance you are pregnant?" "When was your last menstrual period?"       No  Protocols used: Dizziness - Lightheadedness-A-AH

## 2023-04-14 ENCOUNTER — Ambulatory Visit: Payer: BC Managed Care – PPO | Admitting: Family Medicine

## 2023-04-14 VITALS — BP 161/92 | HR 89 | Temp 98.6°F | Ht 66.0 in | Wt 269.0 lb

## 2023-04-14 DIAGNOSIS — R42 Dizziness and giddiness: Secondary | ICD-10-CM

## 2023-04-14 DIAGNOSIS — R002 Palpitations: Secondary | ICD-10-CM | POA: Diagnosis not present

## 2023-04-14 DIAGNOSIS — R0600 Dyspnea, unspecified: Secondary | ICD-10-CM

## 2023-04-14 DIAGNOSIS — J0110 Acute frontal sinusitis, unspecified: Secondary | ICD-10-CM

## 2023-04-14 MED ORDER — FLUTICASONE PROPIONATE 50 MCG/ACT NA SUSP
2.0000 | Freq: Every day | NASAL | 6 refills | Status: AC
Start: 2023-04-14 — End: ?

## 2023-04-14 MED ORDER — AZITHROMYCIN 250 MG PO TABS
ORAL_TABLET | ORAL | 0 refills | Status: AC
Start: 2023-04-14 — End: 2023-04-19
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
## 2023-04-20 ENCOUNTER — Other Ambulatory Visit: Payer: Self-pay | Admitting: Family Medicine

## 2023-04-20 DIAGNOSIS — I10 Essential (primary) hypertension: Secondary | ICD-10-CM

## 2023-04-21 NOTE — Telephone Encounter (Signed)
Requested Prescriptions  Pending Prescriptions Disp Refills   metoprolol tartrate (LOPRESSOR) 25 MG tablet [Pharmacy Med Name: METOPROLOL TARTRATE 25 MG TAB] 180 tablet 0    Sig: TAKE 1 TABLET BY MOUTH TWICE A DAY     Cardiovascular:  Beta Blockers Failed - 04/20/2023  1:33 PM      Failed - Last BP in normal range    BP Readings from Last 1 Encounters:  04/14/23 (!) 161/92         Passed - Last Heart Rate in normal range    Pulse Readings from Last 1 Encounters:  04/14/23 89         Passed - Valid encounter within last 6 months    Recent Outpatient Visits           1 week ago Dizziness on standing   [place] [facility] Malva Limes, MD   1 month ago No-show for appointment   [facility] Malva Limes, MD   2 years ago Primary hypertension   [place] [facility] Malva Limes, MD   2 years ago Pustule   [facility] [place], [place], [place]   3 years ago Urinary frequency   [place] [facility] Malva Limes, MD       Future Appointments             In 5 days Fisher, Demetrios Isaacs, MD [facility]

## 2023-04-26 ENCOUNTER — Encounter: Payer: BC Managed Care – PPO | Admitting: Family Medicine

## 2023-05-18 ENCOUNTER — Encounter: Payer: BC Managed Care – PPO | Admitting: Family Medicine

## 2023-06-16 ENCOUNTER — Ambulatory Visit (INDEPENDENT_AMBULATORY_CARE_PROVIDER_SITE_OTHER): Payer: BC Managed Care – PPO | Admitting: Family Medicine

## 2023-06-16 ENCOUNTER — Other Ambulatory Visit (HOSPITAL_COMMUNITY)
Admission: RE | Admit: 2023-06-16 | Discharge: 2023-06-16 | Disposition: A | Discharge status: Home / Self Care | Payer: BC Managed Care – PPO | Source: Ambulatory Visit | Attending: Family Medicine | Admitting: Family Medicine

## 2023-06-16 ENCOUNTER — Encounter: Payer: Self-pay | Admitting: Family Medicine

## 2023-06-16 VITALS — BP 148/83 | HR 92 | Ht 66.0 in | Wt 266.0 lb

## 2023-06-16 DIAGNOSIS — Z124 Encounter for screening for malignant neoplasm of cervix: Secondary | ICD-10-CM | POA: Diagnosis not present

## 2023-06-16 DIAGNOSIS — Z1211 Encounter for screening for malignant neoplasm of colon: Secondary | ICD-10-CM

## 2023-06-16 DIAGNOSIS — Z01419 Encounter for gynecological examination (general) (routine) without abnormal findings: Secondary | ICD-10-CM | POA: Diagnosis not present

## 2023-06-16 DIAGNOSIS — Z23 Encounter for immunization: Secondary | ICD-10-CM

## 2023-06-16 DIAGNOSIS — Z1231 Encounter for screening mammogram for malignant neoplasm of breast: Secondary | ICD-10-CM

## 2023-06-16 NOTE — Patient Instructions (Addendum)
Please review the attached list of medications and notify my office if there are any errors.   The CDC recommends two doses of Shingrix (the shingles vaccine) separated by 2 to 6 months for adults age 59 years and older. I recommend checking with your pharmacy plan regarding coverage for this vaccine.   Please call the [facility] [phone]) to schedule a routine screening mammogram.

## 2023-06-16 NOTE — Progress Notes (Signed)
Complete physical exam   Patient: Erin Macdonald   DOB: Jun 12, 1966   57 y.o. Female  MRN: [id number] Visit Date: 06/16/2023  Today's healthcare provider: Mila Merry, MD   Chief Complaint  Patient presents with   Annual Exam    No concerns today    Subjective    Discussed the use of AI scribe software for clinical note transcription with the patient, who gave verbal consent to proceed.  History of Present Illness   The patient presented for a routine physical examination. She reported no specific health concerns. The patient's last Cologuard was three years ago, and she agreed to repeat the Cologuard test. She has not been seeing a gynecologist for Pap smears and mammograms, and agreed to have these tests done during the current visit.  The patient had blood work done in July, which was all normal, due to episodes of dizziness. She reported that the dizziness has since resolved, with only occasional mild episodes. She denied experiencing any chest pain, heart flutter, or shortness of breath.  The patient had a tetanus shot in 2017 due to a work-related injury. She reported feeling unwell for a couple of days after receiving the shot.  The patient reported no issues with her hearing or vision. However, she has not had an eye exam in over two years and was advised to schedule a routine eye exam.      HPI     Annual Exam    Additional comments: No concerns today       Last edited by Rolly Salter, CMA on 06/16/2023 10:17 AM.       Past Medical History:  Diagnosis Date   Anemia    Hypertension    Thyroid disease    Past Surgical History:  Procedure Laterality Date   CESAREAN SECTION  57 and 54   Social History   Socioeconomic History   Marital status: Married    Spouse name: Not on file   Number of children: 3   Years of education: Not on file   Highest education level: Not on file  Occupational History   Not on file  Tobacco Use   Smoking status:  Never   Smokeless tobacco: Never  Substance and Sexual Activity   Alcohol use: No   Drug use: No   Sexual activity: Not on file  Other Topics Concern   Not on file  Social History Narrative   Not on file   Social Determinants of Health   Financial Resource Strain: Not on file  Food Insecurity: Not on file  Transportation Needs: Not on file  Physical Activity: Not on file  Stress: Not on file  Social Connections: Not on file  Intimate Partner Violence: Not on file   Family Status  Relation Name Status   Mother  Alive   Father  Deceased   Brother  Deceased       MVA  No partnership data on file   Family History  Problem Relation Age of Onset   Hypertension Mother    Dementia Mother    Diabetes Father    Hypertension Father    No Known Allergies  Patient Care Team: Malva Limes, MD as PCP - General (Family Medicine)   Medications: Outpatient Medications Prior to Visit  Medication Sig   ergocalciferol, VITAMIN D2, (DRISDOL) 200 MCG/ML drops Take 200 mcg by mouth daily.   methimazole (TAPAZOLE) 10 MG tablet TAKE 1 TABLET BY MOUTH EVERY DAY  metoprolol tartrate (LOPRESSOR) 25 MG tablet TAKE 1 TABLET BY MOUTH TWICE A DAY   Cholecalciferol (VITAMIN D3) 125 MCG (5000 UT) CAPS Take 5,000 Units by mouth daily. (Patient not taking: Reported on 06/16/2023)   fluticasone (FLONASE) 50 MCG/ACT nasal spray Place 2 sprays into both nostrils daily. (Patient not taking: Reported on 06/16/2023)   triamcinolone cream (KENALOG) 0.1 % Apply 1 application topically 2 (two) times daily. (Patient not taking: Reported on 06/16/2023)   No facility-administered medications prior to visit.    Review of Systems    Objective    BP (!) 148/83 (BP Location: Left Arm, Patient Position: Sitting, Cuff Size: Large)   Pulse 92   Ht 5\' 6"  (1.676 m)   Wt 266 lb (120.7 kg)   BMI 42.93 kg/m    Physical Exam  General Appearance:    Severely obese female. Alert, cooperative, in no acute  distress, appears stated age   Head:    Normocephalic, without obvious abnormality, atraumatic  Eyes:    PERRL, conjunctiva/corneas clear, EOM's intact, fundi    benign, both eyes  Ears:    Normal TM's and external ear canals, both ears  Nose:   Nares normal, septum midline, mucosa normal, no drainage    or sinus tenderness  Throat:   Lips, mucosa, and tongue normal; teeth and gums normal  Neck:   Supple, symmetrical, trachea midline, no adenopathy;    thyroid:  no enlargement/tenderness/nodules; no carotid   bruit or JVD  Back:     Symmetric, no curvature, ROM normal, no CVA tenderness  Lungs:     Clear to auscultation bilaterally, respirations unlabored  Chest Wall:    No tenderness or deformity   Heart:    Normal heart rate. Normal rhythm.  2/6 systolic murmur at right upper sternal border  Breast Exam:    normal appearance, no masses or tenderness  Abdomen:     Soft, non-tender, bowel sounds active all four quadrants,    no masses, no organomegaly  Pelvic:    external genitalia normal, urethra without abnormality or discharge, and vagina normal without discharge  Extremities:   All extremities are intact. No cyanosis or edema  Pulses:   2+ and symmetric all extremities  Skin:   Skin color, texture, turgor normal, no rashes or lesions  Lymph nodes:   Cervical, supraclavicular, and axillary nodes normal  Neurologic:   CNII-XII intact, normal strength, sensation and reflexes    throughout       Last depression screening scores    06/16/2023   10:21 AM 12/17/2020    3:58 PM 11/28/2019    9:59 AM  PHQ 2/9 Scores  PHQ - 2 Score 4 2 2   PHQ- 9 Score 11 4 4    Last fall risk screening    12/17/2020    3:58 PM  Fall Risk   Falls in the past year? 1  Number falls in past yr: 0   Last Audit-C alcohol use screening    12/17/2020    3:58 PM  Alcohol Use Disorder Test (AUDIT)  1. How often do you have a drink containing alcohol? 0  2. How many drinks containing alcohol do you have on  a typical day when you are drinking? 0  3. How often do you have six or more drinks on one occasion? 0  AUDIT-C Score 0   A score of 3 or more in women, and 4 or more in men indicates increased risk for alcohol abuse, EXCEPT  if all of the points are from question 1   No results found for any visits on 06/16/23.  Assessment & Plan    Routine Health Maintenance and Physical Exam  Exercise Activities and Dietary recommendations  Goals   None      There is no immunization history on file for this patient.  Health Maintenance  Topic Date Due   HIV Screening  Never done   Hepatitis C Screening  Never done   DTaP/Tdap/Td (1 - Tdap) Never done   Zoster Vaccines- Shingrix (1 of 2) Never done   MAMMOGRAM  01/19/2019   Cervical Cancer Screening (HPV/Pap Cotest)  08/10/2020   Fecal DNA (Cologuard)  12/18/2022   INFLUENZA VACCINE  Never done   COVID-19 Vaccine (3 - 2023-24 season) 05/16/2023   HPV VACCINES  Aged Out    Discussed health benefits of physical activity, and encouraged her to engage in regular exercise appropriate for her age and condition.     Colon Cancer Screening Due for repeat Cologuard test, last performed three years ago. -Order Cologuard test.  Women's Health Due for Pap smear and mammogram, not currently seeing a gynecologist. -Perform Pap smear today. -Order mammogram, provide patient with contact number to schedule.  Eye Health No recent eye exam, last visit to ophthalmologist was more than two years ago due to thyroid concerns. -Recommend routine eye exam, provide referral if needed.  Vaccinations Shingles vaccines. -Advise patient to consider receiving shingles vaccine at next visit or at a pharmacy.     No follow-ups on file.        Mila Merry, MD  [facility] [phone] (phone) [phone] (fax)  [facility]

## 2023-06-21 LAB — CYTOLOGY - PAP
Adequacy: ABSENT
Comment: NEGATIVE
Diagnosis: NEGATIVE
High risk HPV: NEGATIVE

## 2023-07-22 DIAGNOSIS — Z1211 Encounter for screening for malignant neoplasm of colon: Secondary | ICD-10-CM | POA: Diagnosis not present

## 2023-07-29 ENCOUNTER — Other Ambulatory Visit: Payer: Self-pay | Admitting: Family Medicine

## 2023-07-29 DIAGNOSIS — I10 Essential (primary) hypertension: Secondary | ICD-10-CM

## 2023-07-29 NOTE — Telephone Encounter (Signed)
Requested Prescriptions  Pending Prescriptions Disp Refills   metoprolol tartrate (LOPRESSOR) 25 MG tablet [Pharmacy Med Name: METOPROLOL TARTRATE 25 MG TAB] 180 tablet 0    Sig: TAKE 1 TABLET BY MOUTH TWICE A DAY     Cardiovascular:  Beta Blockers Failed - 07/29/2023  2:30 AM      Failed - Last BP in normal range    BP Readings from Last 1 Encounters:  06/16/23 (!) 148/83         Passed - Last Heart Rate in normal range    Pulse Readings from Last 1 Encounters:  06/16/23 92         Passed - Valid encounter within last 6 months    Recent Outpatient Visits           1 month ago Well woman exam with routine gynecological exam   [facility] Malva Limes, MD   3 months ago Dizziness on standing   [facility] Malva Limes, MD   4 months ago No-show for appointment   [facility] Malva Limes, MD   2 years ago Primary hypertension   [facility] Malva Limes, MD   3 years ago Pustule   [facility] [place], Ricki Rodriguez [place], [place]

## 2023-07-30 LAB — COLOGUARD: COLOGUARD: NEGATIVE

## 2023-11-11 ENCOUNTER — Other Ambulatory Visit: Payer: Self-pay | Admitting: Family Medicine

## 2023-11-11 DIAGNOSIS — I10 Essential (primary) hypertension: Secondary | ICD-10-CM

## 2023-12-29 ENCOUNTER — Other Ambulatory Visit: Payer: Self-pay | Admitting: Family Medicine

## 2023-12-29 DIAGNOSIS — I10 Essential (primary) hypertension: Secondary | ICD-10-CM

## 2023-12-29 NOTE — Telephone Encounter (Signed)
 Copied from CRM [id number]. Topic: Clinical - Medication Refill >> Dec 29, 2023 10:28 AM [name] Case wrote: Most Recent Primary Care Visit:  Provider: Lamon Pillow  Department: [id number]  Visit Type: PHYSICAL  Date: 06/16/2023  Medication: metoprolol tartrate (LOPRESSOR) 25 MG tablet Patient states she never picked up rx in February 2025, as she had not requested refill herself. Pt close to running out requesting refill be sent to pharmacy. Pt spoke to pharmacy and told no other rx available for pick up.   Has the patient contacted their pharmacy? Yes Patient informed to reach out to office.   Is this the correct pharmacy for this prescription? Yes This is the patient's preferred pharmacy:  [id number] [place], [place] - [address] [place] [address] Phone: [phone] Fax: [phone]   Has the prescription been filled recently? No  Is the patient out of the medication? No, has 5 tablets left.  Has the patient been seen for an appointment in the last year OR does the patient have an upcoming appointment? Yes  Can we respond through MyChart? Yes  Agent: Please be advised that Rx refills may take up to 3 business days. We ask that you follow-up with your pharmacy.

## 2023-12-30 MED ORDER — METOPROLOL TARTRATE 25 MG PO TABS: 25.0000 mg | ORAL_TABLET | Freq: Two times a day (BID) | ORAL | 1 refills | Status: AC

## 2023-12-30 NOTE — Telephone Encounter (Signed)
 LOV 06/16/2023    Requested Prescriptions  Pending Prescriptions Disp Refills   metoprolol tartrate (LOPRESSOR) 25 MG tablet 180 tablet 1    Sig: Take 1 tablet (25 mg total) by mouth 2 (two) times daily.     Cardiovascular:  Beta Blockers Failed - 12/30/2023 11:44 AM      Failed - Last BP in normal range    BP Readings from Last 1 Encounters:  06/16/23 (!) 148/83         Failed - Valid encounter within last 6 months    Recent Outpatient Visits   None            Passed - Last Heart Rate in normal range    Pulse Readings from Last 1 Encounters:  06/16/23 92

## 2024-09-29 ENCOUNTER — Other Ambulatory Visit: Payer: Self-pay | Admitting: Family Medicine

## 2024-09-29 DIAGNOSIS — I10 Essential (primary) hypertension: Secondary | ICD-10-CM
